# Patient Record
Sex: Female | Born: 1955 | Race: White | Hispanic: No | Marital: Married | Smoking: Never smoker
Health system: Southern US, Community
[De-identification: ages and names within clinical notes are randomized; demographics above are authoritative.]

## PROBLEM LIST (undated history)

## (undated) DIAGNOSIS — N809 Endometriosis, unspecified: Secondary | ICD-10-CM

## (undated) DIAGNOSIS — E785 Hyperlipidemia, unspecified: Secondary | ICD-10-CM

## (undated) DIAGNOSIS — K219 Gastro-esophageal reflux disease without esophagitis: Secondary | ICD-10-CM

## (undated) DIAGNOSIS — R7303 Prediabetes: Secondary | ICD-10-CM

## (undated) DIAGNOSIS — R109 Unspecified abdominal pain: Secondary | ICD-10-CM

## (undated) DIAGNOSIS — F988 Other specified behavioral and emotional disorders with onset usually occurring in childhood and adolescence: Secondary | ICD-10-CM

## (undated) HISTORY — DX: Prediabetes: R73.03

## (undated) HISTORY — PX: ABDOMINAL HYSTERECTOMY: SHX81

## (undated) HISTORY — DX: Other specified behavioral and emotional disorders with onset usually occurring in childhood and adolescence: F98.8

## (undated) HISTORY — DX: Hyperlipidemia, unspecified: E78.5

## (undated) HISTORY — PX: TONSILLECTOMY: SUR1361

## (undated) HISTORY — DX: Gastro-esophageal reflux disease without esophagitis: K21.9

---

## 1999-05-03 ENCOUNTER — Other Ambulatory Visit: Admission: RE | Admit: 1999-05-03 | Discharge: 1999-05-03 | Payer: Self-pay | Admitting: *Deleted

## 2000-06-10 ENCOUNTER — Other Ambulatory Visit: Admission: RE | Admit: 2000-06-10 | Discharge: 2000-06-10 | Payer: Self-pay | Admitting: *Deleted

## 2000-08-25 ENCOUNTER — Encounter (INDEPENDENT_AMBULATORY_CARE_PROVIDER_SITE_OTHER): Payer: Self-pay

## 2000-08-25 ENCOUNTER — Inpatient Hospital Stay (HOSPITAL_COMMUNITY): Admission: RE | Admit: 2000-08-25 | Discharge: 2000-08-27 | Payer: Self-pay | Admitting: *Deleted

## 2001-11-30 ENCOUNTER — Other Ambulatory Visit: Admission: RE | Admit: 2001-11-30 | Discharge: 2001-11-30 | Payer: Self-pay | Admitting: *Deleted

## 2002-02-16 ENCOUNTER — Encounter: Payer: Self-pay | Admitting: *Deleted

## 2002-02-16 ENCOUNTER — Encounter: Admission: RE | Admit: 2002-02-16 | Discharge: 2002-02-16 | Payer: Self-pay | Admitting: *Deleted

## 2002-12-10 ENCOUNTER — Other Ambulatory Visit: Admission: RE | Admit: 2002-12-10 | Discharge: 2002-12-10 | Payer: Self-pay | Admitting: Obstetrics and Gynecology

## 2003-12-12 ENCOUNTER — Other Ambulatory Visit: Admission: RE | Admit: 2003-12-12 | Discharge: 2003-12-12 | Payer: Self-pay | Admitting: *Deleted

## 2004-07-17 ENCOUNTER — Encounter: Admission: RE | Admit: 2004-07-17 | Discharge: 2004-07-17 | Payer: Self-pay | Admitting: Orthopedic Surgery

## 2004-07-23 ENCOUNTER — Encounter: Admission: RE | Admit: 2004-07-23 | Discharge: 2004-07-23 | Payer: Self-pay | Admitting: Orthopedic Surgery

## 2004-08-15 ENCOUNTER — Ambulatory Visit (HOSPITAL_COMMUNITY): Admission: RE | Admit: 2004-08-15 | Discharge: 2004-08-15 | Payer: Self-pay | Admitting: Orthopedic Surgery

## 2005-02-06 ENCOUNTER — Other Ambulatory Visit: Admission: RE | Admit: 2005-02-06 | Discharge: 2005-02-06 | Payer: Self-pay | Admitting: Obstetrics and Gynecology

## 2006-06-30 ENCOUNTER — Encounter: Payer: Self-pay | Admitting: Family Medicine

## 2006-10-27 ENCOUNTER — Ambulatory Visit: Payer: Self-pay | Admitting: Family Medicine

## 2006-12-16 ENCOUNTER — Ambulatory Visit: Payer: Self-pay | Admitting: Family Medicine

## 2008-02-29 ENCOUNTER — Ambulatory Visit: Payer: Self-pay | Admitting: Family Medicine

## 2008-02-29 DIAGNOSIS — R5381 Other malaise: Secondary | ICD-10-CM

## 2008-02-29 DIAGNOSIS — M79609 Pain in unspecified limb: Secondary | ICD-10-CM

## 2008-02-29 DIAGNOSIS — R5383 Other fatigue: Secondary | ICD-10-CM | POA: Insufficient documentation

## 2008-02-29 DIAGNOSIS — IMO0001 Reserved for inherently not codable concepts without codable children: Secondary | ICD-10-CM

## 2008-03-02 LAB — CONVERTED CEMR LAB
AST: 27 units/L (ref 0–37)
Albumin: 4.2 g/dL (ref 3.5–5.2)
Alkaline Phosphatase: 41 units/L (ref 39–117)
BUN: 16 mg/dL (ref 6–23)
Basophils Relative: 0.6 % (ref 0.0–1.0)
Bilirubin, Direct: 0.1 mg/dL (ref 0.0–0.3)
Chloride: 101 meq/L (ref 96–112)
Eosinophils Relative: 1.5 % (ref 0.0–5.0)
Glucose, Bld: 91 mg/dL (ref 70–99)
Hemoglobin: 13 g/dL (ref 12.0–15.0)
Lymphocytes Relative: 38.4 % (ref 12.0–46.0)
Monocytes Relative: 7.4 % (ref 3.0–12.0)
Neutro Abs: 3.3 10*3/uL (ref 1.4–7.7)
Neutrophils Relative %: 52.1 % (ref 43.0–77.0)
Potassium: 4.6 meq/L (ref 3.5–5.1)
RBC: 4.45 M/uL (ref 3.87–5.11)
Sodium: 141 meq/L (ref 135–145)
TSH: 1.31 microintl units/mL (ref 0.35–5.50)
Total Protein: 7.2 g/dL (ref 6.0–8.3)
WBC: 6.4 10*3/uL (ref 4.5–10.5)

## 2008-03-08 ENCOUNTER — Encounter: Payer: Self-pay | Admitting: Family Medicine

## 2008-03-08 DIAGNOSIS — N809 Endometriosis, unspecified: Secondary | ICD-10-CM | POA: Insufficient documentation

## 2008-11-03 ENCOUNTER — Telehealth (INDEPENDENT_AMBULATORY_CARE_PROVIDER_SITE_OTHER): Payer: Self-pay | Admitting: *Deleted

## 2009-03-15 ENCOUNTER — Ambulatory Visit: Payer: Self-pay | Admitting: Family Medicine

## 2009-03-15 DIAGNOSIS — S139XXA Sprain of joints and ligaments of unspecified parts of neck, initial encounter: Secondary | ICD-10-CM

## 2009-05-31 LAB — CONVERTED CEMR LAB
Pap Smear: NORMAL
Pap Smear: NORMAL
Pap Smear: NORMAL

## 2009-07-28 ENCOUNTER — Telehealth: Payer: Self-pay | Admitting: Family Medicine

## 2009-08-31 ENCOUNTER — Telehealth: Payer: Self-pay | Admitting: Family Medicine

## 2009-09-04 ENCOUNTER — Ambulatory Visit: Payer: Self-pay | Admitting: Family Medicine

## 2009-09-04 DIAGNOSIS — M542 Cervicalgia: Secondary | ICD-10-CM

## 2009-09-04 DIAGNOSIS — M62838 Other muscle spasm: Secondary | ICD-10-CM

## 2009-10-03 ENCOUNTER — Encounter: Payer: Self-pay | Admitting: Family Medicine

## 2009-10-24 ENCOUNTER — Encounter: Payer: Self-pay | Admitting: Family Medicine

## 2009-11-17 ENCOUNTER — Telehealth: Payer: Self-pay | Admitting: Family Medicine

## 2009-11-29 ENCOUNTER — Telehealth: Payer: Self-pay | Admitting: Family Medicine

## 2010-02-15 ENCOUNTER — Telehealth: Payer: Self-pay | Admitting: Family Medicine

## 2010-03-26 ENCOUNTER — Telehealth (INDEPENDENT_AMBULATORY_CARE_PROVIDER_SITE_OTHER): Payer: Self-pay | Admitting: *Deleted

## 2010-03-27 ENCOUNTER — Ambulatory Visit: Payer: Self-pay | Admitting: Family Medicine

## 2010-04-03 LAB — CONVERTED CEMR LAB
ALT: 12 units/L (ref 0–35)
AST: 18 units/L (ref 0–37)
Alkaline Phosphatase: 42 units/L (ref 39–117)
BUN: 13 mg/dL (ref 6–23)
Basophils Relative: 0.7 % (ref 0.0–3.0)
Chloride: 110 meq/L (ref 96–112)
Cholesterol: 192 mg/dL (ref 0–200)
Eosinophils Absolute: 0.1 10*3/uL (ref 0.0–0.7)
Eosinophils Relative: 2 % (ref 0.0–5.0)
GFR calc non Af Amer: 74.73 mL/min (ref >60)
Glucose, Bld: 92 mg/dL (ref 70–99)
Hemoglobin: 11.7 g/dL — ABNORMAL LOW (ref 12.0–15.0)
LDL Cholesterol: 108 mg/dL — ABNORMAL HIGH (ref 0–99)
Lymphocytes Relative: 37.7 % (ref 12.0–46.0)
MCHC: 33.4 g/dL (ref 30.0–36.0)
Monocytes Relative: 9 % (ref 3.0–12.0)
Neutro Abs: 2.9 10*3/uL (ref 1.4–7.7)
Neutrophils Relative %: 50.6 % (ref 43.0–77.0)
Potassium: 4.3 meq/L (ref 3.5–5.1)
RBC: 4.3 M/uL (ref 3.87–5.11)
Sodium: 144 meq/L (ref 135–145)
Total Bilirubin: 0.4 mg/dL (ref 0.3–1.2)
VLDL: 20.6 mg/dL (ref 0.0–40.0)
WBC: 5.7 10*3/uL (ref 4.5–10.5)

## 2010-04-20 ENCOUNTER — Ambulatory Visit: Payer: Self-pay | Admitting: Family Medicine

## 2010-06-15 ENCOUNTER — Emergency Department (HOSPITAL_COMMUNITY): Admission: EM | Admit: 2010-06-15 | Discharge: 2010-06-15 | Payer: Self-pay | Admitting: Family Medicine

## 2010-08-16 ENCOUNTER — Encounter: Admission: RE | Admit: 2010-08-16 | Discharge: 2010-08-16 | Payer: Self-pay | Admitting: Obstetrics and Gynecology

## 2010-10-30 NOTE — Progress Notes (Signed)
----   Converted from flag ---- ---- 03/26/2010 9:38 AM, Hannah Beat MD wrote: Prephysical Labs, several days before, fasting BMP, HFP, FLP, CBC with diff, TSH: v77.91, v77.1, ,780.79  ---- 03/26/2010 7:55 AM, Liane Comber CMA (AAMA) wrote: Ermalene Searing pt is scheduled for cpx labs tomorrow, what labs to draw and dx codes? Thanks Tasha ------------------------------

## 2010-10-30 NOTE — Progress Notes (Signed)
Summary: diflunec sod  Phone Note Refill Request Message from:  Scriptline on November 29, 2009 11:30 AM  Refills Requested: Medication #1:  DICLOFENAC SODIUM 75 MG TBEC 1 tab by mouth two times a day   Supply Requested: [facility] month [facility] [address]   Method Requested: Electronic Initial call taken by: Benny Lennert CMA (AAMA),  November 29, 2009 11:30 AM    Prescriptions: DICLOFENAC SODIUM 75 MG TBEC (DICLOFENAC SODIUM) 1 tab by mouth two times a day  #30 x 0   Entered and Authorized by:   Kerby Nora MD   Signed by:   Kerby Nora MD on 11/29/2009   Method used:   [facility] to        [facility]  [id number]       [address]       [place], [place]  [address]       Ph: [phone] or [phone]       Fax: [phone]   RxID:   [id number]

## 2010-10-30 NOTE — Assessment & Plan Note (Signed)
Summary: CPX/DLO   Vital Signs:  Patient profile:   55 year old female Height:      65.5 inches Weight:      134.4 pounds BMI:     22.10 Temp:     98.0 degrees F oral Pulse rate:   80 / minute Pulse rhythm:   regular BP sitting:   110 / 70  (left arm) Cuff size:   regular  Vitals Entered By: Benny Lennert CMA Duncan Dull) (April 20, 2010 9:58 AM)  History of Present Illness: Chief complain Chronic med management  13 lb weight loss in past year. Eating healthy and exercising intermittantly. once a week.  Does regular stretching exercsies.   HAS intermittant right trapezius strain..uses skelaxin and diclofenac about 1 time a week. HAs seen Dr. Patsy Lager.   Reviewed labs and prevention in detail.  Goes to GYN for CPX.   Problems Prior to Update: 1)  Screening For Diabetes Mellitus  (ICD-V77.1) 2)  Screening For Lipoid Disorders  (ICD-V77.91) 3)  Neck Pain  (ICD-723.1) 4)  Muscle Spasm, Trapezius  (ICD-728.85) 5)  Cervical Muscle Strain  (ICD-847.0) 6)  Endometriosis  (ICD-617.9) 7)  Leg Pain  (ICD-729.5) 8)  Fatigue, Chronic  (ICD-780.79) 9)  Myalgia  (ICD-729.1)  Current Medications (verified): 1)  Valtrex 1 Gm  Tabs (Valacyclovir Hcl) .... Take 1 Tablet By Mouth Once A Day 2)  Vivelle 0.05 Mg/24hr Pttw (Estradiol) .... ? Strength.  Change Patch Twice Weekly. 3)  Diclofenac Sodium 75 Mg Tbec (Diclofenac Sodium) .Marland Kitchen.. 1 Tab By Mouth Two Times A Day 4)  Skelaxin 800 Mg Tabs (Metaxalone) .Marland Kitchen.. 1 By Mouth Three Times A Day As Needed Muscle Spasm  Allergies (verified): No Known Drug Allergies  Past History:  Past medical, surgical, family and social histories (including risk factors) reviewed, and no changes noted (except as noted below).  Past Medical History: Reviewed history from 03/08/2008 and no changes required. Current Problems:  DEGENERATIVE DISC DISEASE (ICD-722.6) ENDOMETRIOSIS (ICD-617.9) LEG PAIN (ICD-729.5) FATIGUE, CHRONIC (ICD-780.79) MYALGIA (ICD-729.1)    Past Surgical History: Reviewed history from 03/08/2008 and no changes required. Hysterectomy, partial, has L ovary 2000 Tonsillectomy MRI, left hip, partial left hamstring tear and tendonopathy 2005 2 views cervical spine  Family History: Reviewed history from 03/08/2008 and no changes required. no RA, SLE, IBD Father: Died 31, bladder CA Mother: Alive 53, breast CA at age 43 Siblings: 2 brothers with high cholesterol, 2 sisters with DM No MI < 30  Social History: Reviewed history from 03/08/2008 and no changes required. 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  Review of Systems General:  Denies chills and fatigue. CV:  Denies chest pain or discomfort. Resp:  Denies shortness of breath. GI:  Denies abdominal pain. GU:  Denies dysuria. Derm:  Denies lesion(s).  Physical Exam  General:  Well-developed,well-nourished,in no acute distress; alert,appropriate and cooperative throughout examination Eyes:  No corneal or conjunctival inflammation noted. EOMI. Perrla. Funduscopic exam benign, without hemorrhages, exudates or papilledema. Vision grossly normal. Ears:  External ear exam shows no significant lesions or deformities.  Otoscopic examination reveals clear canals, tympanic membranes are intact bilaterally without bulging, retraction, inflammation or discharge. Hearing is grossly normal bilaterally. Nose:  External nasal examination shows no deformity or inflammation. Nasal mucosa are pink and moist without lesions  or exudates. Mouth:  Oral mucosa and oropharynx without lesions or exudates.  Teeth in good repair. Neck:  no carotid bruit or thyromegaly no cervical or supraclavicular lymphadenopathy  Breasts:  per  GYN Lungs:  Normal respiratory effort, chest expands symmetrically. Lungs are clear to auscultation, no crackles or wheezes. Heart:  Normal rate and regular rhythm. S1 and S2 normal without gallop, murmur, click, rub or other extra sounds. Abdomen:  Bowel sounds positive,abdomen soft and non-tender without masses, organomegaly or hernias noted. Genitalia:  per GYN Msk:  No deformity or scoliosis noted of thoracic or lumbar spine.   Pulses:  R and L posterior tibial pulses are full and equal bilaterally  Extremities:  no edema  Neurologic:  No cranial nerve deficits noted. Station and gait are normal.  Sensory, motor and coordinative functions appear intact. Skin:  Intact without suspicious lesions or rashes Psych:  Cognition and judgment appear intact. Alert and cooperative with normal attention span and concentration. No apparent delusions, illusions, hallucinations   Impression & Recommendations:  Problem # 1:  MUSCLE SPASM, TRAPEZIUS (ICD-728.85) Skelaxin and NSAID as needed.  Problem # 2:  FATIGUE, CHRONIC (ICD-780.79) Encouraged regular exercsie.Ashley Lin states this has improved some. Still significant stress in life.   Complete Medication List: 1)  Valtrex 1 Gm Tabs (Valacyclovir hcl) .... Take 1 tablet by mouth once a day 2)  Vivelle 0.05 Mg/24hr Pttw (Estradiol) .... ? strength.  change patch twice weekly. 3)  Diclofenac Sodium 75 Mg Tbec (Diclofenac sodium) .Marland Kitchen.. 1 tab by mouth two times a day 4)  Skelaxin 800 Mg Tabs (Metaxalone) .Marland Kitchen.. 1 by mouth three times a day as needed muscle spasm  Other Orders: Tdap => 34yrs IM ([address]) Admin 1st Vaccine (60454)  Patient Instructions: 1)  Please schedule a follow-up appointment in 1 year.  Prescriptions: DICLOFENAC SODIUM 75 MG TBEC (DICLOFENAC SODIUM) 1 tab by mouth two times a day  #30 x 0   Entered and Authorized by:   Kerby Nora MD   Signed by:   Kerby Nora MD on 04/20/2010   Method used:   Print then Give to Patient    RxID:   [id number] SKELAXIN 800 MG TABS (METAXALONE) 1 by mouth three times a day as needed muscle spasm  #30 x 0   Entered and Authorized by:   Kerby Nora MD   Signed by:   Kerby Nora MD on 04/20/2010   Method used:   Print then Give to Patient   RxID:   [id number]   Current Allergies (reviewed today): No known allergies   Last PAP:  Normal (06/30/2006 9:15:11 AM) PAP Result Date:  05/31/2009 PAP Result:  normal PAP Next Due:  1 yr Last Mammogram:  Normal (09/30/2005 9:15:11 AM) Mammogram Result Date:  05/31/2009 Mammogram Result:  normal Mammogram Next Due:  1 yr     Tetanus/Td Vaccine    Vaccine Type: Tdap    Site: left deltoid    Mfr: GlaxoSmithKline    Dose: 0.5 ml    Route: IM    Given by: Benny Lennert CMA (AAMA)    Exp. Date: 12/23/2011    Lot #: [id number]    VIS given: 08/18/07 version given April 20, 2010.     Immunizations Administered:  Tetanus Vaccine:    Vaccine Type: Tdap    Site: left deltoid    Mfr: GlaxoSmithKline    Dose: 0.5 ml    Route: IM    Given by: Benny Lennert CMA (AAMA)  Exp. Date: 12/23/2011    Lot #: [id number]    VIS given: 08/18/07 version given April 20, 2010.

## 2010-10-30 NOTE — Letter (Signed)
Summary: Patient Does Not Want to Schedule/[facility]  Patient Does Not Want to Schedule/[facility]   Imported By: Lanelle Bal 10/16/2009 08:43:46  _____________________________________________________________________  External Attachment:    Type:   Image     Comment:   External Document

## 2010-10-30 NOTE — Progress Notes (Signed)
Summary: cyclobenzeprine  Phone Note Refill Request Message from:  Scriptline on Feb 15, 2010 11:32 AM  Refills Requested: Medication #1:  CYCLOBENZAPRINE HCL 5 MG TABS 1-2 tab by mouth at bedtime as needed muscle spasm   Supply Requested: 1 month [facility] [address]   Method Requested: Electronic Initial call taken by: Benny Lennert CMA Duncan Dull),  Feb 15, 2010 11:32 AM  Follow-up for Phone Call        No refills until reeeval as stated at last refill. Also due for CPX if not done elsewhere.  Follow-up by: Kerby Nora MD,  Feb 16, 2010 1:28 PM  Additional Follow-up for Phone Call Additional follow up Details #1::        Advised pharmacist and pt.  Pt has a physical scheduled for july and she says she will wait till then. Additional Follow-up by: Lowella Petties CMA,  Feb 16, 2010 2:15 PM

## 2010-10-30 NOTE — Procedures (Signed)
Summary: Colonoscopy by Dr.William Outlaw,[facility]  Colonoscopy by Dr[facility]William Outlaw,[facility]   Imported By: Beau Fanny 10/31/2009 16:28:02  _____________________________________________________________________  External Attachment:    Type:   Image     Comment:   External Document  Appended Document: Orders Update     Clinical Lists Changes  Observations: Added new observation of HEMOCULTDUE: Not Indicated (11/01/2009 8:38) Added new observation of FLEXSIGDUE: Not Indicated (11/01/2009 8:38) Added new observation of LST COLON DT: 11/01/2009 (11/01/2009 8:38) Added new observation of COLONNXTDUE: 11/01/2014 (11/01/2009 8:38) Added new observation of COLONOSCOPY: hemmorhoids (11/01/2009 8:38) Added new observation of FLUVAXDUE: 08/01/2007 (11/01/2009 8:38) Added new observation of TDBOOSTDUE: 09/30/2008 (11/01/2009 8:38) Added new observation of PAP DUE: 07/01/2007 (11/01/2009 8:38) Added new observation of MAMMO DUE: 09/30/2006 (11/01/2009 8:38) Added new observation of CREATNXTDUE: 02/28/2009 (11/01/2009 8:38) Added new observation of POTASSIUMDUE: 02/28/2009 (11/01/2009 8:38)      Flex Sig Next Due:  Not Indicated Colonoscopy Result Date:  11/01/2009 Colonoscopy Result:  hemmorhoids Colonoscopy Next Due:  5 yr Hemoccult Next Due:  Not Indicated

## 2010-10-30 NOTE — Progress Notes (Signed)
Summary: cyclobenzaprine  Phone Note Refill Request Message from:  Scriptline on November 17, 2009 10:19 AM  Refills Requested: Medication #1:  CYCLOBENZAPRINE HCL 5 MG TABS 1-2 tab by mouth at bedtime as needed muscle spasm   Supply Requested: 1 month [facility] [address]   Method Requested: Electronic Initial call taken by: Benny Lennert CMA Duncan Dull),  November 17, 2009 10:20 AM  Follow-up for Phone Call        If needs further refills..needs to be reevaluated.  Follow-up by: Kerby Nora MD,  November 17, 2009 11:06 AM  Additional Follow-up for Phone Call Additional follow up Details #1::        rx called in and patient advisd.Consuello Masse CMA  Additional Follow-up by: Benny Lennert CMA Duncan Dull),  November 17, 2009 11:37 AM    Prescriptions: CYCLOBENZAPRINE HCL 5 MG TABS (CYCLOBENZAPRINE HCL) 1-2 tab by mouth at bedtime as needed muscle spasm  #20 x 0   Entered and Authorized by:   Kerby Nora MD   Signed by:   Kerby Nora MD on 11/17/2009   Method used:   [facility]ph[facility] to ...       [facility]  [address] [id number]       [address]       [place], [place]  [address]       Ph: [phone] or [phone]       Fax: [phone]   RxID:   [id number]

## 2011-02-15 NOTE — Assessment & Plan Note (Signed)
[facility]                           [place] OFFICE NOTE   Ashley Lin, Ashley Lin                  MRN:          [id number]  DATE:10/27/2006                            DOB:          [birth year]    NEW PATIENT HISTORY AND PHYSICAL:   CHIEF COMPLAINT:  A 55 year old white female here to establish a new  doctor.   HISTORY OF PRESENT ILLNESS:  The patient comes to the clinic today with  the following concerns.  1. Rash, chronic.  She states that she first experienced what was      diagnosed as shingles 4 years ago on her right upper buttock.  She      was treated with prednisone and Valtrex, and the rash resolved.      Over the past few years, she continues to have recurrences, about 6      times since then.  She notices the breakouts with stress and      fatigue.  They are always over the same area of her right buttock.      She states that she recently is getting over an area that is      healing now.  Her previous doctor had felt that this is most likely      not shingles and a type of herpes.  The rash is usually clear white      blisters on a red background, tender, and is associated with      fatigue.  She is on Valtrex prevention daily and increases to      higher doses when she has an outbreak.  2. Urinary urge, chronic.  She has been noticing a constant urge to      urinate for about 6 months.  She never feels completely empty.  She      was at her gynecologist, and he examined her and stated that she      did not have a cystocele, but felt that her bladder could be      getting kinked.  He gave her Vesicare samples, which did not help      after being on them for 2 weeks.  She states that the urge to      urinate is worse when she is sitting.  She frequently urinates tiny      amounts with occasional full bladder voids.  She occasionally loses      urine with sneezing about once every few months.  She denies      dysuria or blood in  her urine.  3. Bilateral buttock pain, chronic.  For many years, she has had this      concern with a constant ache in both her buttocks.  There is no      tenderness to palpation to touch, and no change in the pain with      going up steps.  She does have some pain, though, when she lays in      1 position at night.  She denies weakness, numbness.  She has had      somewhat of a work-up of  this previously with x-rays and an MRI of      her left hip that only showed possible partial left hamstring, and      all this was done in 2005.  She had also had a bone scan that      reflected the same.  She has seen 2 orthopedic doctors who felt      that there was no arthritis or specific injury other than hamstring      strain at that point in time.   ALLERGIES:  None.   MEDICATIONS:  1. Valtrex daily.  2. Multivitamin daily.  3. Vivelle one patch two times per week.   PAST MEDICAL HISTORY:  1. Endometriosis.  2. Degenerative disk disease.   HOSPITALIZATIONS/SURGERIES/PROCEDURES:  1. 2000 - hysterectomy, partial, having only left ovary for      endometriosis.  2. Tonsillectomy.  3. 2005 - MRI, left hip.  Partial left hamstring.  4. Two view cervical spine showing mild degenerative disk disease.  5. Mammogram in January 2007 negative.  6. Pap smear in October 2007 negative.   SOCIAL HISTORY:  She does not smoke.  She drinks 1 or 2 alcoholic  beverages every few months.  She does not use drugs.  She works as a  Naval architect in a Engineer, mining.  She is divorced and now  single, but has a live-in partner for many years.  She denies any  physical or verbal abuse.  She has 2 children.  She walks nightly for  about half an hour to 45 minutes at a time.  For her diet, she states it  is fairly healthy with fruits, vegetables, water and only occasional  fast food, but she does skip breakfast frequently.   FAMILY HISTORY:  Father deceased at age 29 with bladder cancer.  Mother   alive at age 31 with breast cancer which she developed at age 9.  No  family history of myocardial infarction less than 65.  She has two  brothers, one with elevated cholesterol.  She was two sisters, one of  whom has diabetes.  There is no other type of cancer that runs in the  family.   PHYSICAL EXAMINATION:  VITAL SIGNS:  Height 64.5 inches.  Weight 141.6.  Blood pressure 120/70, pulse 76, temperature 97.7.  GENERAL:  Healthy-appearing female in no apparent distress.  HEENT:  Pupils equal, round and reactive to light.  Extraocular muscles  intact.  Oropharynx clear.  Tympanic membranes clear.  Nares clear.  NECK:  No thyromegaly.  No lymphadenopathy, supraclavicular or cervical.  CARDIOVASCULAR:  Regular rate and rhythm.  No murmurs, rubs, or gallops.  PULMONARY:  Clear to auscultation bilaterally.  No wheezes, rales or  rhonchi.  ABDOMEN:  Soft, nontender.  Normoactive bowel sounds.  No  hepatosplenomegaly.  MUSCULOSKELETAL:  Strength 5/5 in the upper and lower extremities.  Low  back nontender to palpation.  Negative straight leg test.  Negative  Faber's.  NEUROLOGIC:  Cranial nerves II-XII grossly intact.  Reflexes 2+  patellar.  Sensation intact in upper and lower extremities.  SKIN:  Healing rash on the right upper buttock.  No blisters at this  point in time.   ASSESSMENT AND PLAN:  1. 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  To      verify this, we can do a viral culture by unroofing  a vesicle if      the returns.  She may continue with her current treatment at this      point in time, because I do feel it is appropriate.  We did discuss      the consideration of just treating outbreaks as opposed to being on      preventative medicine.  2. Urinary urgency.  She may have a structural issue that is causing     her urinary urge, given the fact that is gets worse when she is      sitting.   Her other symptoms do sound like bladder spasm.  I will      try her on Detrol LA to determine if this medication helps more      than the Vesicare.  I will also refer her to a urologist for      assessment for structural issues causing her symptoms.  3. Bilateral buttock pain.  She has a normal neurologic exam and no      evidence of radiculopathy from sciatica.  She also has no evidence      of sacroiliac strain.  It is unclear at this point what is causing      her tenderness, if it is not just musculoskeletal tightness.  She      was given exercises to do to stretch her low back and hips.  She      may use diclofenac 75 mg p.o. b.i.d. p.r.n. pain.  In the meantime,      I will obtain previous records from her doctor to determine what      has been done in the past to look into this.  4. Prevention.  At this point in time, she is up to date with      prevention, except for a cholesterol panel, which we will obtain.      I will obtain records from her previous doctor.     Kerby Nora, MD  Electronically Signed    AB/MedQ  DD: 10/28/2006  DT: 10/28/2006  Job #: [id number]

## 2011-02-15 NOTE — Op Note (Signed)
[facility]  Patient:    Ashley Lin, Ashley Lin           MRN: [id number] Proc. Date: 08/25/00 Adm. Date:  [id number] Attending:  Donne Hazel                           Operative Report  PREOPERATIVE DIAGNOSES: 1. Pelvic pain. 2. History of endometriosis. 3. Abnormal uterine bleeding.  POSTOPERATIVE DIAGNOSES: 1. Pelvic pain. 2. History of endometriosis. 3. Abnormal uterine bleeding. 4. Endometriosis noted.  PROCEDURES: 1. Laparoscopic-assisted vaginal hysterectomy. 2. Right salpingo-oophorectomy.  SURGEON:  Willey Blade, M.D.  ASSISTANT:  Duke Salvia. Marcelle Overlie, M.D.  ESTIMATED BLOOD LOSS:  200 cc.  ANESTHESIA:  General endotracheal.  FINDING AT TIME OF LAPAROSCOPY:  Three small areas of endometriosis were noted in the right pelvis in the paraovarian region.  This was noted in the mesovarian and near the right tube.  The left ovary was normal.  The right ovary also normal except for the small endometriosis.  The uterus was upper limits of normal.  The upper abdomen was not visualized.  The bowel appeared to be normal.  DESCRIPTION OF PROCEDURE:  The patient was taken to the operating room where a general endotracheal anesthetic was administered.  The patient was placed on the operating table in dorsal lithotomy position.  The abdomen, perineum, and vagina was prepped and draped in the usual sterile fashion with Betadine scrub and paint.  The patient did receive a preoperative antibiotic.  A Foley catheter was inserted.  A Hulka tenaculum was placed inside the intrauterine cavity for uterine manipulation.  Next, a small transverse infraumbilical skin incision was made through which a Veress needle was inserted atraumatically into the abdominal cavity.  A pneumoperitoneum was then created with two liters of carbon dioxide gas.  The Veress needle was removed, and a disposable laparoscopic trocar was inserted atraumatically into the  abdominal cavity.  A laparoscope was then inserted with the above-noted findings.  Two accessory incisions were then made two fingerbreadths above the pubic symphysis and in the right and left lower quadrants.  Through these, two 5 mm trocars were inserted in the usual fashion and under direct, continuous laparoscopic guidance.  Next, the pelvis was visualized with the above-noted findings.  The most notable finding were three small areas of endometriosis in the right pelvis near the ovary.  There was no further endometriosis identified.  Next, attention was turned to right oophorectomy.  A 5 mm scope was placed, and the infundibulopelvic ligament was clamped well away from the right ureter and close to the right ovary.  This was done with an endoscopic staple.  Another staple line was placed in the uterine ovarian area and extending to the round ligament.  Next, a single suture line of endoscopic staples was placed on the uterine ovarian ligament on the left. Attention was then turned to the vaginal portion of this procedure.  The Hulka tenaculum was removed, and a weighted speculum was placed in the posterior fornix of the vagina.  The cervix was grasped with a Jacobs tenaculum and the posterior cul-de-sac sharply and atraumatically entered. The uterosacral ligaments were then clamped bilaterally and sharply created. These were then suture ligated with a transfixing suture of 0 Vicryl.  A circumferential incision was made around the anterior portion of the cervix, and a bladder flap sharply and bluntly created over the lower uterine segment. A curved Deaver placed behind the bladder  to ensure its protection during the procedure.  Successive bites were then carried up the body of the uterus until the suture lines of endoscopic staples were met.  These were all sharply created with curved Heaney clamps and suture ligated with 0 Vicryl suture. All vascular pedicles were created close to  the uterine body.  The suture lines were eventually met and the uterus dissected free easily.  The operative areas were visualized and hemostasis noted.  Attention was then turned to closure of the vaginal cuff.  First, the posterior cul-de-sac was obliterated by transfixing and tying the uterosacral ligaments together.  This was done with two figure-of-eight sutures of 0 Vicryl.  Again, good hemostasis was noted.  The vaginal cuff was then closed in an anterior to posterior direction with multiple interrupted figure-of-eight sutures of 0 Vicryl.  Good hemostasis was noted.  The laparoscope was then used to visualize the operative areas, and good hemostasis was noted throughout.  Of note, the three small areas of endometriosis were cauterized thoroughly with the bipolar cautery prior to dissection.  Good hemostasis was then noted.  All abdominal instruments were then removed.  The small infraumbilical skin incision was closed with two sutures of 0 Vicryl in the muscle fascia.  The skin incisions were then closed with multiple interrupted sutures of 3-0 Vicryl rapide.  The patient was then awakened, extubated, and taken to the recovery room in good condition.  There were no postoperative complications.  Final sponge, needle, and instrument counts were correct x 3. DD:  08/25/00 TD:  08/25/00 Job: [id number]

## 2011-02-15 NOTE — Discharge Summary (Signed)
[facility]  Patient:    Ashley Lin, Ashley Lin           MRN: [id number] Adm. Date:  [id number] Disc. Date: [id number] Attending:  Donne Hazel                           Discharge Summary  HISTORY OF PRESENT ILLNESS:  Ms. Ashley Lin is a 55 year old female, gravida 3, para 3, status post bilateral tubal ligation, the patient was admitted for definitive treatment for chronic pelvic pain. She has a history of endometriosis and a history of chronic, recurrent acute and chronic pelvic pain. The patient has undergone a series of conservative medical management and at this time is admitted for definitive therapy in the form of laparoscopically assisted vaginal hysterectomy and right salpingo-oophorectomy. She has mostly had pain on her right side.  ADMITTING DIAGNOSES: 1. Pelvic pain--acute and chronic. 2. History of endometriosis. 3. History of abnormal uterine bleeding.  HOSPITAL COURSE:  The patient was admitted on the same day of surgery, November 26, where she underwent a laparoscopically assisted vaginal hysterectomy and right salpingo-oophorectomy. The surgery went well and blood loss was approximately 100 cc. A small amount of endometriosis was encountered at the time of surgery. However, the surgery went well.  The patients postoperative course also went very well. She was slowly advanced to a regular diet which she was tolerating well prior to discharge. Her hemoglobin dropped from a preoperative value of 10.7 to a stable value of 8.0 postoperatively. She was asymptomatic with this degree of anemia. She was ambulatory and quickly resumed normal bowel and bladder function after her surgery. She had no complications in the postoperative course and is routinely discharged on postoperative day #2 in stable condition.  DISCHARGE DIAGNOSES: 1. Status post laparoscopically assisted vaginal hysterectomy and right    salpingo-oophorectomy for  pelvic pain and abnormal uterine bleeding. 2. Anemia--stable and asymptomatic.  PLAN: 1. Home. 2. Routine postoperative instructions given. 3. Tylox #40. 4. Nu-Iron 150 to start in 1 week after bowel activity returns to normal. 5. Nothing per vagina x 6 weeks. No heavy lifting. 6. Follow-up in the office in 1 week to recheck hemoglobin and for further    postoperative evaluation.       DD:  09/08/00 TD:  09/08/00 Job: [id number]

## 2011-12-24 ENCOUNTER — Emergency Department (INDEPENDENT_AMBULATORY_CARE_PROVIDER_SITE_OTHER)
Admission: EM | Admit: 2011-12-24 | Discharge: 2011-12-24 | Disposition: A | Discharge status: Home / Self Care | Payer: 59 | Source: Home / Self Care | Attending: Family Medicine | Admitting: Family Medicine

## 2011-12-24 ENCOUNTER — Telehealth: Payer: Self-pay | Admitting: Family Medicine

## 2011-12-24 ENCOUNTER — Encounter (HOSPITAL_COMMUNITY): Payer: Self-pay | Admitting: *Deleted

## 2011-12-24 DIAGNOSIS — N949 Unspecified condition associated with female genital organs and menstrual cycle: Secondary | ICD-10-CM

## 2011-12-24 DIAGNOSIS — R102 Pelvic and perineal pain: Secondary | ICD-10-CM

## 2011-12-24 LAB — POCT URINALYSIS DIP (DEVICE)
Bilirubin Urine: NEGATIVE
Glucose, UA: NEGATIVE mg/dL
Ketones, ur: NEGATIVE mg/dL
Leukocytes, UA: NEGATIVE
Nitrite: NEGATIVE
pH: 7 (ref 5.0–8.0)

## 2011-12-24 MED ORDER — TAMSULOSIN HCL 0.4 MG PO CAPS: 0.4000 mg | ORAL_CAPSULE | Freq: Every day | ORAL | Status: DC

## 2011-12-24 NOTE — ED Notes (Signed)
Pt  Reports     Pain  r  Side      Since  Last   Week  denya  ny  Discharge  Bleeding    Or  Other  Symptoms  Pt  Reports  She  Has  Had  A  Hysterectomy       In past  -  She  Is  Sitting  Upright on  Exam table  Speaking in  Complete  sentances       Skin is  Warm  /  Dry

## 2011-12-24 NOTE — Discharge Instructions (Signed)
Drink plenty of fluids, see dr Rana Snare for recheck and urology referral if indicated.

## 2011-12-24 NOTE — Telephone Encounter (Signed)
To: [facility] (Daytime Triage) Fax: [phone] From: [facility] Date/ Time: 12/24/2011 9:39 AM Taken By: Patriciaann Clan, RN Caller: Debbie Facility: Not Collected Patient: Ashley Lin DOB: May 17, 1956 Phone: [phone] Reason for Call: Caller was unable to be reached on callback - Left Message Regarding Appointment: No Appt Date: Appt Time: Unknown Provider: Reason: Details: Outcome:

## 2011-12-24 NOTE — ED Provider Notes (Signed)
History     CSN: [id number]  Arrival date & time 12/24/11  1421   First MD Initiated Contact with Patient 12/24/11 1511      Chief Complaint  Patient presents with  . Abdominal Pain    (Consider location/radiation/quality/duration/timing/severity/associated sxs/prior treatment) Patient is a 56 y.o. female presenting with abdominal pain. The history is provided by the patient.  Abdominal Pain The primary symptoms of the illness include abdominal pain. The primary symptoms of the illness do not include nausea, vomiting, diarrhea, dysuria, vaginal discharge or vaginal bleeding. The current episode started more than 2 days ago (1 1/2 wk of sx rlq pain.). The onset of the illness was gradual. The problem has not changed since onset. The patient states that she believes she is currently not pregnant. Additional symptoms associated with the illness include back pain. Symptoms associated with the illness do not include chills, anorexia, urgency or frequency. Associated medical issues comments: s/p hyst and right oophorectomy for endometriosis.Marland Kitchen    History reviewed. No pertinent past medical history.  Past Surgical History  Procedure Date  . Abdominal hysterectomy     History reviewed. No pertinent family history.  History  Substance Use Topics  . Smoking status: Not on file  . Smokeless tobacco: Not on file  . Alcohol Use: Yes     occasonal    OB History    Grav Para Term Preterm Abortions TAB SAB Ect Mult Living                  Review of Systems  Constitutional: Negative for chills.  Gastrointestinal: Positive for abdominal pain. Negative for nausea, vomiting, diarrhea and anorexia.  Genitourinary: Negative for dysuria, urgency, frequency, vaginal bleeding and vaginal discharge.  Musculoskeletal: Positive for back pain.    Allergies  Review of patient's allergies indicates no known allergies.  Home Medications   Current Outpatient Rx  Name Route Sig Dispense Refill    . TAMSULOSIN HCL 0.4 MG PO CAPS Oral Take 1 capsule (0.4 mg total) by mouth daily. 30 capsule 0    BP 129/72  Pulse 80  Temp(Src) 98.5 F (36.9 C) (Oral)  Resp 16  SpO2 98%  Physical Exam  Nursing note and vitals reviewed. Constitutional: She appears well-developed and well-nourished.  HENT:  Head: Normocephalic.  Abdominal: Soft. Normal appearance and bowel sounds are normal. She exhibits no distension and no mass. There is tenderness. There is no rigidity, no rebound, no guarding and no CVA tenderness. Hernia confirmed negative in the right inguinal area.      ED Course  Procedures (including critical care time)  Labs Reviewed  POCT URINALYSIS DIP (DEVICE) - Abnormal; Notable for the following:    Hgb urine dipstick SMALL (*)    All other components within normal limits   No results found.   1. Pelvic pain in female       MDM  U/a tr bld        Linna Hoff, MD 12/24/11 1550

## 2011-12-29 NOTE — Telephone Encounter (Signed)
Please try to contact pt once again to make sure needs met. Please document if reached or not.

## 2011-12-30 NOTE — Telephone Encounter (Signed)
Could not contact patient but, she was seen in UC the same day

## 2012-07-23 ENCOUNTER — Encounter: Payer: Self-pay | Admitting: Family Medicine

## 2012-07-23 ENCOUNTER — Other Ambulatory Visit: Payer: Self-pay | Admitting: Family Medicine

## 2012-07-23 ENCOUNTER — Ambulatory Visit (INDEPENDENT_AMBULATORY_CARE_PROVIDER_SITE_OTHER): Payer: 59 | Admitting: Family Medicine

## 2012-07-23 VITALS — BP 102/68 | HR 68 | Temp 98.0°F | Wt 154.5 lb

## 2012-07-23 DIAGNOSIS — R319 Hematuria, unspecified: Secondary | ICD-10-CM

## 2012-07-23 DIAGNOSIS — R1013 Epigastric pain: Secondary | ICD-10-CM | POA: Insufficient documentation

## 2012-07-23 DIAGNOSIS — R3129 Other microscopic hematuria: Secondary | ICD-10-CM | POA: Insufficient documentation

## 2012-07-23 DIAGNOSIS — R1031 Right lower quadrant pain: Secondary | ICD-10-CM | POA: Insufficient documentation

## 2012-07-23 LAB — POCT UA - MICROSCOPIC ONLY
RBC, urine, microscopic: 0
WBC, Ur, HPF, POC: 0

## 2012-07-23 LAB — POCT URINALYSIS DIPSTICK
Bilirubin, UA: NEGATIVE
Glucose, UA: NEGATIVE
Leukocytes, UA: NEGATIVE
Nitrite, UA: NEGATIVE
Urobilinogen, UA: 0.2

## 2012-07-23 NOTE — Assessment & Plan Note (Signed)
Trace seen on dip repeatedly but none on POC micro.. Will send to lab for micro for verification.

## 2012-07-23 NOTE — Assessment & Plan Note (Signed)
Sister with stomach cancer, passed away from this at young age. Likely due to gastritis vs. PUD, vs pancreatitis. Less liekly cancer or gallstones. Eval with  Labs including Hpylori.

## 2012-07-23 NOTE — Patient Instructions (Addendum)
Start prilosec 2 tabs of 20 mg daily. Avoid citris, tomato, caffeine, alcohol, peppermint, spicy foods, greasy foods. Small meals, don't eat late. Stop by labs on your way out. We will call you with the results.  Make follow up apt in 2 weeks follow up abdominal pain, epigastric and right lower quadrant.Marland Kitchen

## 2012-07-23 NOTE — Assessment & Plan Note (Signed)
?   Endometriosis, doubt appendicitis, no ovary or uterus. Less likely kidney stone.

## 2012-07-23 NOTE — Progress Notes (Signed)
Subjective:    Patient ID: Ashley Lin, female    DOB: 11-27-1955, 56 y.o.   MRN: [id number]  HPI  56 year old female with history of endometriosis, s/p abdominal hysterectomy (has left ovary) presents with  right lower abdominal pain. 10/10 pain yesterday. Pain is constant but intermittantly worse. Has been ongoing for 7 months. Pain has gradually worsen in last few weeks. Nothing makes it worse, no change with movement or food. Not any better with BM or foods.Took old Vicodin she had which helped. Occ pain wakes her up while sleeping. No Diarrhea/Constipation currently, has had slight constipation in past. No dysuria. No N/V.  She is sexually active, one partner.  She also had some upper abdominal pain triggered by greasy food soda, alcohol, associated with nausea. occ heartburn and reflux. Pepcid helped This pain is occuring daily.  Sister with stomach cancer, passed away in last year... She is worried about this. Father with bladder cancer.   Seen in urgent care in 11/2011 UA showed trace blood, thought ? kidney stones. No imaging done at that point. Has been told at GYN she has had blood in urine but never had work up.  On estrogen patch   Last pelvic exam with Dr. Rana Snare in 2013, nml pap smear.  Nml colonoscopy in 2011 with Dr. Dulce Sellar at [place]. Review of Systems  Constitutional: Negative for fever and fatigue.  HENT: Negative for ear pain.   Eyes: Negative for pain.  Respiratory: Negative for chest tightness and shortness of breath.   Cardiovascular: Negative for chest pain, palpitations and leg swelling.  Gastrointestinal: Positive for nausea, abdominal pain and constipation. Negative for vomiting, diarrhea, blood in stool, abdominal distention and rectal pain.  Genitourinary: Negative for dysuria.       Objective:   Physical Exam  Constitutional: Vital signs are normal. She appears well-developed and well-nourished. She is cooperative.  Non-toxic appearance. She does  not appear ill. No distress.  HENT:  Head: Normocephalic.  Right Ear: Hearing, tympanic membrane, external ear and ear canal normal. Tympanic membrane is not erythematous, not retracted and not bulging.  Left Ear: Hearing, tympanic membrane, external ear and ear canal normal. Tympanic membrane is not erythematous, not retracted and not bulging.  Nose: No mucosal edema or rhinorrhea. Right sinus exhibits no maxillary sinus tenderness and no frontal sinus tenderness. Left sinus exhibits no maxillary sinus tenderness and no frontal sinus tenderness.  Mouth/Throat: Uvula is midline, oropharynx is clear and moist and mucous membranes are normal.  Eyes: Conjunctivae normal, EOM and lids are normal. Pupils are equal, round, and reactive to light. No foreign bodies found.  Neck: Trachea normal and normal range of motion. Neck supple. Carotid bruit is not present. No mass and no thyromegaly present.  Cardiovascular: Normal rate, regular rhythm, S1 normal, S2 normal, normal heart sounds, intact distal pulses and normal pulses.  Exam reveals no gallop and no friction rub.   No murmur heard. Pulmonary/Chest: Effort normal and breath sounds normal. Not tachypneic. No respiratory distress. She has no decreased breath sounds. She has no wheezes. She has no rhonchi. She has no rales.  Abdominal: Soft. Normal appearance and bowel sounds are normal. There is no hepatosplenomegaly. There is tenderness in the right lower quadrant and epigastric area. There is no rigidity, no rebound, no guarding, no CVA tenderness and negative Murphy's sign. No hernia.  Neurological: She is alert.  Skin: Skin is warm, dry and intact. No rash noted.  Psychiatric: Her speech is normal  and behavior is normal. Judgment and thought content normal. Her mood appears not anxious. Cognition and memory are normal. She does not exhibit a depressed mood.          Assessment & Plan:

## 2012-07-24 LAB — COMPREHENSIVE METABOLIC PANEL
ALT: 28 U/L (ref 0–35)
AST: 36 U/L (ref 0–37)
Albumin: 4.2 g/dL (ref 3.5–5.2)
Alkaline Phosphatase: 44 U/L (ref 39–117)
BUN: 17 mg/dL (ref 6–23)
Creatinine, Ser: 1.1 mg/dL (ref 0.4–1.2)
Potassium: 5.1 mEq/L (ref 3.5–5.1)

## 2012-07-24 LAB — URINALYSIS, MICROSCOPIC ONLY
Bacteria, UA: NONE SEEN
Casts: NONE SEEN

## 2012-07-28 NOTE — Addendum Note (Signed)
Addended by: Kerby Nora E on: 07/28/2012 02:16 AM   Modules accepted: Orders

## 2012-07-29 ENCOUNTER — Ambulatory Visit (INDEPENDENT_AMBULATORY_CARE_PROVIDER_SITE_OTHER)
Admission: RE | Admit: 2012-07-29 | Discharge: 2012-07-29 | Disposition: A | Discharge status: Home / Self Care | Payer: 59 | Source: Ambulatory Visit | Attending: Family Medicine | Admitting: Family Medicine

## 2012-07-29 DIAGNOSIS — R1031 Right lower quadrant pain: Secondary | ICD-10-CM

## 2012-07-29 DIAGNOSIS — R1013 Epigastric pain: Secondary | ICD-10-CM

## 2012-07-29 DIAGNOSIS — R3129 Other microscopic hematuria: Secondary | ICD-10-CM

## 2012-07-31 ENCOUNTER — Telehealth: Payer: Self-pay | Admitting: Family Medicine

## 2012-07-31 DIAGNOSIS — R319 Hematuria, unspecified: Secondary | ICD-10-CM

## 2012-07-31 MED ORDER — TRAMADOL HCL 50 MG PO TABS: 50.0000 mg | ORAL_TABLET | Freq: Three times a day (TID) | ORAL | Status: DC | PRN

## 2012-07-31 NOTE — Telephone Encounter (Signed)
Message copied by Excell Seltzer on Fri Jul 31, 2012 12:44 AM ------      Message from: Baldomero Lamy      Created: Thu Jul 30, 2012 10:43 AM       Spoke with pt , she is agreable to uro referral and is ok with seeing Dr in [place]. She also says the prilosec 2 tabs has helped a lot, but she is still having back and flank pain. She is requesting a rx for a pain med called in to [facility] in [place]

## 2012-08-07 ENCOUNTER — Encounter: Payer: Self-pay | Admitting: Family Medicine

## 2012-08-07 ENCOUNTER — Ambulatory Visit (INDEPENDENT_AMBULATORY_CARE_PROVIDER_SITE_OTHER): Payer: 59 | Admitting: Family Medicine

## 2012-08-07 VITALS — BP 110/70 | HR 72 | Temp 97.9°F | Ht 65.5 in | Wt 152.8 lb

## 2012-08-07 DIAGNOSIS — R3129 Other microscopic hematuria: Secondary | ICD-10-CM

## 2012-08-07 DIAGNOSIS — R1013 Epigastric pain: Secondary | ICD-10-CM

## 2012-08-07 DIAGNOSIS — R1031 Right lower quadrant pain: Secondary | ICD-10-CM

## 2012-08-07 NOTE — Assessment & Plan Note (Signed)
Father with bladder cancer, unclear source of hematuria, no stones, no UTI.. referral to uro for possible cystoscopy etc.

## 2012-08-07 NOTE — Patient Instructions (Addendum)
Set up appt with Dr. Rana Snare regarding right lower quadrant pain, ? Endometriosis.  Continue prilosec 40 mg, but call if epigastric pain returns and/or if Dr. Rana Snare think right lower abdominal pain not due to endometriosis for referral to GI.

## 2012-08-07 NOTE — Assessment & Plan Note (Signed)
CT scan showed no abnormality.. this was a noncontrast CT given at the time we were looking for renal stone.  Pain is very low down.. ? Of endometriosis on abdominal wall, she no longer has ovary present on right. She has extensive history of endometriosis in past with multiple laproscopic surgeries.  Also GI source is possible, but she has no clear consistent D/C and she hasd nml colonoscopy in 2011.  Next step is eval by GYN (Dr. Rana Snare) for to rule in/out endometriosis as source of pain, depending on this we will consider referral to GI>

## 2012-08-07 NOTE — Progress Notes (Signed)
Subjective:    Patient ID: Ashley Lin, female    DOB: 1956-09-24, 56 y.o.   MRN: [id number]  HPI 56 year old female with history of endometriosis, s/p abdominal hysterectomy (has left ovary) presents with for follow up of epigastric pain, hematuria and  right lower abdominal pain.   1. Epigastric pain: neg CMET, Hpylori, neg lipase Improved significantly on prilosec 40 mg daily.  Sister with stomach cancer. Slight changes seen in esphagus on CT as below  2. Hematuria: persistant on UA micro. No sign of renal stones. Father with bladder cancer.  Has referral to urology pending on 08/25/2012  3. Right lower abdominal pain: No source of pain seen on CT scan.  Since last week pain seems to radiate to right lower back and down right posterior thigh. Some occ diarrhea in last day, no constipaation, No blood in stool. Some nausea. Tramadol helps some with pain. CT abd pelvis without contrast IMPRESSION:  No renal or ureteral calculi or findings to suggest renal  collecting system obstruction.  Slight thickening of the distal esophagus may be related to under  distension. Mucosal abnormality not entirely excluded.  Last pelvic exam with Dr. Rana Snare in 2013, nml pap smear.  Nml colonoscopy in 2011 with Dr. Dulce Sellar at [place].  Has been having cold symptoms low grade temp of 100 in last week. Some body ache and chills.    Review of Systems  Constitutional: Negative for fever and fatigue.  HENT: Negative for ear pain.   Eyes: Negative for pain.  Respiratory: Negative for chest tightness and shortness of breath.   Cardiovascular: Negative for chest pain, palpitations and leg swelling.  Gastrointestinal: Positive for abdominal pain. Negative for constipation, blood in stool and abdominal distention.  Genitourinary: Negative for dysuria.       Objective:   Physical Exam  Constitutional: Vital signs are normal. She appears well-developed and well-nourished. She is cooperative.   Non-toxic appearance. She does not appear ill. No distress.  HENT:  Head: Normocephalic.  Right Ear: Hearing, tympanic membrane, external ear and ear canal normal. Tympanic membrane is not erythematous, not retracted and not bulging.  Left Ear: Hearing, tympanic membrane, external ear and ear canal normal. Tympanic membrane is not erythematous, not retracted and not bulging.  Nose: No mucosal edema or rhinorrhea. Right sinus exhibits no maxillary sinus tenderness and no frontal sinus tenderness. Left sinus exhibits no maxillary sinus tenderness and no frontal sinus tenderness.  Mouth/Throat: Uvula is midline, oropharynx is clear and moist and mucous membranes are normal.  Eyes: Conjunctivae normal, EOM and lids are normal. Pupils are equal, round, and reactive to light. No foreign bodies found.  Neck: Trachea normal and normal range of motion. Neck supple. Carotid bruit is not present. No mass and no thyromegaly present.  Cardiovascular: Normal rate, regular rhythm, S1 normal, S2 normal, normal heart sounds, intact distal pulses and normal pulses.  Exam reveals no gallop and no friction rub.   No murmur heard. Pulmonary/Chest: Effort normal and breath sounds normal. Not tachypneic. No respiratory distress. She has no decreased breath sounds. She has no wheezes. She has no rhonchi. She has no rales.  Abdominal: Soft. Normal appearance and bowel sounds are normal. There is no hepatosplenomegaly. There is tenderness in the right lower quadrant. There is no rigidity, no rebound, no guarding, no CVA tenderness and negative Murphy's sign. No hernia.         No epigastric ttp this exam  pain in abdomen is very low  on right abdomen  Neurological: She is alert.  Skin: Skin is warm, dry and intact. No rash noted.  Psychiatric: Her speech is normal and behavior is normal. Judgment and thought content normal. Her mood appears not anxious. Cognition and memory are normal. She does not exhibit a depressed mood.           Assessment & Plan:

## 2012-08-07 NOTE — Assessment & Plan Note (Signed)
Likely due to gastritis. No evidence of PUD, Hpylori neg,no pancreatitis as lipase neg.  Improved significantly with diet and 40 mg daily of prilosec. Sister does have stomach cancer, also ? Thickening in distal esophagus likely due to poor distension, but may need to consider GI referral and endo if not continuing to improve.

## 2012-09-03 ENCOUNTER — Other Ambulatory Visit: Payer: Self-pay | Admitting: Family Medicine

## 2013-02-17 ENCOUNTER — Other Ambulatory Visit: Payer: Self-pay | Admitting: Family Medicine

## 2013-02-18 NOTE — Telephone Encounter (Signed)
What is pt using tramadol for at this time ... Abdominal pain? If so she may need re-eval if this is continuing. Let me know.

## 2013-02-19 NOTE — Telephone Encounter (Signed)
Left message for patient to return my call.

## 2013-11-09 ENCOUNTER — Encounter: Payer: Self-pay | Admitting: Family Medicine

## 2013-11-09 ENCOUNTER — Ambulatory Visit (INDEPENDENT_AMBULATORY_CARE_PROVIDER_SITE_OTHER): Payer: 59 | Admitting: Family Medicine

## 2013-11-09 VITALS — BP 114/66 | HR 70 | Temp 97.8°F | Ht 63.75 in | Wt 164.0 lb

## 2013-11-09 DIAGNOSIS — F988 Other specified behavioral and emotional disorders with onset usually occurring in childhood and adolescence: Secondary | ICD-10-CM

## 2013-11-09 DIAGNOSIS — Z13220 Encounter for screening for lipoid disorders: Secondary | ICD-10-CM

## 2013-11-09 DIAGNOSIS — J309 Allergic rhinitis, unspecified: Secondary | ICD-10-CM

## 2013-11-09 DIAGNOSIS — R1031 Right lower quadrant pain: Secondary | ICD-10-CM

## 2013-11-09 LAB — COMPREHENSIVE METABOLIC PANEL
ALBUMIN: 4.6 g/dL (ref 3.5–5.2)
ALT: 23 U/L (ref 0–35)
AST: 24 U/L (ref 0–37)
Alkaline Phosphatase: 54 U/L (ref 39–117)
BUN: 12 mg/dL (ref 6–23)
CALCIUM: 10 mg/dL (ref 8.4–10.5)
CHLORIDE: 105 meq/L (ref 96–112)
CO2: 28 meq/L (ref 19–32)
CREATININE: 0.9 mg/dL (ref 0.4–1.2)
GFR: 68.13 mL/min (ref >60.00)
GLUCOSE: 85 mg/dL (ref 70–99)
POTASSIUM: 4.9 meq/L (ref 3.5–5.1)
Sodium: 140 mEq/L (ref 135–145)
Total Bilirubin: 0.6 mg/dL (ref 0.3–1.2)
Total Protein: 8.1 g/dL (ref 6.0–8.3)

## 2013-11-09 LAB — LIPID PANEL
CHOLESTEROL: 213 mg/dL — AB (ref 0–200)
HDL: 62.3 mg/dL (ref >39.00)
TRIGLYCERIDES: 111 mg/dL (ref 0.0–149.0)
Total CHOL/HDL Ratio: 3
VLDL: 22.2 mg/dL (ref 0.0–40.0)

## 2013-11-09 LAB — LDL CHOLESTEROL, DIRECT: Direct LDL: 130.9 mg/dL

## 2013-11-09 MED ORDER — TRAMADOL HCL 50 MG PO TABS: ORAL_TABLET | ORAL | Status: DC

## 2013-11-09 NOTE — Assessment & Plan Note (Signed)
Trial of zyrtec.  

## 2013-11-09 NOTE — Progress Notes (Signed)
Pre-visit discussion using our clinic review tool. No additional management support is needed unless otherwise documented below in the visit note.  

## 2013-11-09 NOTE — Patient Instructions (Addendum)
Try zyrtec at bedtime for allergy symptoms.  Work on [employer] and [employer]healthy eating and regular exercise.

## 2013-11-09 NOTE — Addendum Note (Signed)
Addended by: Alvina ChouWALSH, Torrez Renfroe J on: 11/09/2013 03:19 PM   Modules accepted: Orders

## 2013-11-09 NOTE — Assessment & Plan Note (Signed)
Discussed dx. Offered referral to [facility].. Pt refused.

## 2013-11-09 NOTE — Progress Notes (Signed)
Subjective:    Patient ID: Ashley Lin, female    DOB: 08/18/1956, 58 y.o.   MRN: [id number]  Sore Throat  Pertinent negatives include no abdominal pain, ear pain or shortness of breath.    The patient is here for annual wellness exam and preventative care.     Epigastric pain in past improved with prilosec 40 mg every few days.   Endometriosis per GYN likely causing RLQ pain: considering laprascope to eval. Not bothering her too much. Using tramadol prn... Rarely needing but wants refill.   She has been having congestion, sore throat.. Off and on over last months.  No fever.  Benadryl helps some.  She has not tried zyrtec .  She does not like nasal spray.  ? ADD, trouble focusing. No depression, no anxiety. Mom said she had issues growing up. Not interested in referral to psych for eval.  Due for chol and DM screen.  Review of Systems  Constitutional: Negative for fever and fatigue.  HENT: Negative for ear pain.   Eyes: Negative for pain.  Respiratory: Negative for chest tightness and shortness of breath.   Cardiovascular: Negative for chest pain, palpitations and leg swelling.  Gastrointestinal: Negative for abdominal pain.  Genitourinary: Negative for dysuria.       Objective:   Physical Exam  Constitutional: Vital signs are normal. She appears well-developed and well-nourished. She is cooperative.  Non-toxic appearance. She does not appear ill. No distress.  HENT:  Head: Normocephalic.  Right Ear: Hearing, tympanic membrane, external ear and ear canal normal.  Left Ear: Hearing, tympanic membrane, external ear and ear canal normal.  Nose: Mucosal edema present.  pale nasal tubrbinates  Eyes: Conjunctivae, EOM and lids are normal. Pupils are equal, round, and reactive to light. Lids are everted and swept, no foreign bodies found.  Neck: Trachea normal and normal range of motion. Neck supple. Carotid bruit is not present. No mass and no thyromegaly present.   Cardiovascular: Normal rate, regular rhythm, S1 normal, S2 normal, normal heart sounds and intact distal pulses.  Exam reveals no gallop.   No murmur heard. Pulmonary/Chest: Effort normal and breath sounds normal. No respiratory distress. She has no wheezes. She has no rhonchi. She has no rales.  Abdominal: Soft. Normal appearance and bowel sounds are normal. She exhibits no distension, no fluid wave, no abdominal bruit and no mass. There is no hepatosplenomegaly. There is no tenderness. There is no rebound, no guarding and no CVA tenderness. No hernia.  Lymphadenopathy:    She has no cervical adenopathy.    She has no axillary adenopathy.  Neurological: She is alert. She has normal strength. No cranial nerve deficit or sensory deficit.  Skin: Skin is warm, dry and intact. No rash noted.  Psychiatric: Her speech is normal and behavior is normal. Judgment normal. Her mood appears not anxious. Cognition and memory are normal. She does not exhibit a depressed mood.          Assessment & Plan:  The patient's preventative maintenance and recommended screening tests for an annual wellness exam were reviewed in full today. Brought up to date unless services declined.  Counselled on the importance of diet, exercise, and its role in overall health and mortality. The patient's FH and SH was reviewed, including their home life, tobacco status, and drug and alcohol status.   Vaccines:uptodate with td. PAP/DVE: per GYN 12/2013 Colon:2011 nml, repeat 5 years given family hx of polyps Mammo:per GYN DEXA: no family hx  of osteoporosis,low risk, on estrogen. Plan on age 54.

## 2013-11-10 ENCOUNTER — Encounter: Payer: Self-pay | Admitting: *Deleted

## 2014-06-03 ENCOUNTER — Ambulatory Visit: Payer: 59 | Admitting: Family Medicine

## 2014-06-10 ENCOUNTER — Ambulatory Visit (INDEPENDENT_AMBULATORY_CARE_PROVIDER_SITE_OTHER): Payer: 59 | Admitting: Family Medicine

## 2014-06-10 ENCOUNTER — Encounter: Payer: Self-pay | Admitting: Family Medicine

## 2014-06-10 ENCOUNTER — Encounter (INDEPENDENT_AMBULATORY_CARE_PROVIDER_SITE_OTHER): Payer: Self-pay

## 2014-06-10 VITALS — BP 100/66 | HR 82 | Temp 97.9°F | Ht 63.75 in | Wt 148.5 lb

## 2014-06-10 DIAGNOSIS — M6740 Ganglion, unspecified site: Secondary | ICD-10-CM

## 2014-06-10 DIAGNOSIS — M67449 Ganglion, unspecified hand: Secondary | ICD-10-CM | POA: Insufficient documentation

## 2014-06-10 NOTE — Assessment & Plan Note (Signed)
Refer to hand specialist for aspiration or removal.

## 2014-06-10 NOTE — Progress Notes (Signed)
   Subjective:    Patient ID: Ashley Lin, female    DOB: September 21, 1956, 58 y.o.   MRN: [id number]  HPI   58 year old female present with new onset nodule on right 2nd digit x for several months. Nodule and joint at DIP is tender if she hits it. No heat.  No discharges. No fever. She has noted also in 3rd and 4th digits nodules appearing.   She has tenderness in joints Bilaterally, worse on right. No other joint issues.  Review of Systems  Constitutional: Negative for fever and fatigue.  HENT: Negative for ear pain.   Eyes: Negative for pain.  Respiratory: Negative for chest tightness and shortness of breath.   Cardiovascular: Negative for chest pain, palpitations and leg swelling.  Gastrointestinal: Negative for abdominal pain.  Genitourinary: Negative for dysuria.       Objective:   Physical Exam  Constitutional: She appears well-developed.  Neck: Normal range of motion. Neck supple.  Cardiovascular: Normal rate.   Pulmonary/Chest: Effort normal and breath sounds normal.  Abdominal: Soft. Bowel sounds are normal.  Musculoskeletal:  Right 2nd digit with 4 mm round red nodule, appears fluid filled, clear, non tender to palpation. B PIP joints with deformity, Heberden's nodules          Assessment & Plan:

## 2014-06-10 NOTE — Progress Notes (Signed)
Pre visit review using our clinic review tool, if applicable. No additional management support is needed unless otherwise documented below in the visit note. 

## 2014-06-10 NOTE — Patient Instructions (Signed)
Stop at front desk for referral. 

## 2015-04-20 ENCOUNTER — Telehealth: Payer: Self-pay

## 2015-04-20 NOTE — Telephone Encounter (Signed)
Left a message for patient to return my call about having a Mammogram set up. Will await call back.  

## 2015-07-07 ENCOUNTER — Encounter: Payer: Self-pay | Admitting: Family Medicine

## 2015-07-07 ENCOUNTER — Ambulatory Visit (INDEPENDENT_AMBULATORY_CARE_PROVIDER_SITE_OTHER): Payer: 59 | Admitting: Family Medicine

## 2015-07-07 VITALS — BP 98/62 | HR 103 | Temp 99.1°F | Ht 63.75 in | Wt 128.5 lb

## 2015-07-07 DIAGNOSIS — N10 Acute tubulo-interstitial nephritis: Secondary | ICD-10-CM | POA: Insufficient documentation

## 2015-07-07 DIAGNOSIS — E860 Dehydration: Secondary | ICD-10-CM

## 2015-07-07 DIAGNOSIS — R300 Dysuria: Secondary | ICD-10-CM

## 2015-07-07 LAB — POCT URINALYSIS DIP (MANUAL ENTRY)
Glucose, UA: NEGATIVE
Nitrite, UA: POSITIVE — AB
Spec Grav, UA: >=1.03
UROBILINOGEN UA: 4
pH, UA: 6

## 2015-07-07 MED ORDER — CIPROFLOXACIN HCL 500 MG PO TABS: 500.0000 mg | ORAL_TABLET | Freq: Two times a day (BID) | ORAL | Status: DC

## 2015-07-07 MED ORDER — FLUCONAZOLE 150 MG PO TABS: 150.0000 mg | ORAL_TABLET | Freq: Once | ORAL | Status: DC

## 2015-07-07 NOTE — Patient Instructions (Addendum)
Push fluids like water.  Stop aspirin. Use tylenol instead for fever.   Start oral antibitoics (cipro twice daily).. If unable to keep medication and fluids down, or fever continues on antibiotics, go to ER. We will call with urine results. If yeast infection symptoms... Use over the counter yeast cream until antibiotics are gone then take diflucan.   Pyelonephritis, Adult Pyelonephritis is a kidney infection. The kidneys are the organs that filter a person's blood and move waste out of the bloodstream and into the urine. Urine passes from the kidneys, through the ureters, and into the bladder. There are two main types of pyelonephritis:  Infections that come on quickly without any warning (acute pyelonephritis).  Infections that last for a long period of time (chronic pyelonephritis). In most cases, the infection clears up with treatment and does not cause further problems. More severe infections or chronic infections can sometimes spread to the bloodstream or lead to other problems with the kidneys. CAUSES This condition is usually caused by:  Bacteria traveling from the bladder to the kidney through infected urine. The urine in the bladder can become infected with bacteria from:  Bladder infection (cystitis).  Inflammation of the prostate gland (prostatitis).  Sexual intercourse, in females.  Bacteria traveling from the bloodstream to the kidney. RISK FACTORS This condition is more likely to develop in:  Pregnant women.  Older people.  People who have diabetes.  People who have kidney stones or bladder stones.  People who have other abnormalities of the kidney or ureter.  People who have a catheter placed in the bladder.  People who have cancer.  People who are sexually active.  Women who use spermicides.  People who have had a prior urinary tract infection. SYMPTOMS Symptoms of this condition include:  Frequent urination.  Strong or persistent urge to  urinate.  Burning or stinging when urinating.  Abdominal pain.  Back pain.  Pain in the side or flank area.  Fever.  Chills.  Blood in the urine, or dark urine.  Nausea.  Vomiting. DIAGNOSIS This condition may be diagnosed based on:  Medical history and physical exam.  Urine tests.  Blood tests. You may also have imaging tests of the kidneys, such as an ultrasound or CT scan. TREATMENT Treatment for this condition may depend on the severity of the infection.  If the infection is mild and is found early, you may be treated with antibiotic medicines taken by mouth. You will need to drink fluids to remain hydrated.  If the infection is more severe, you may need to stay in the hospital and receive antibiotics given directly into a vein through an IV tube. You may also need to receive fluids through an IV tube if you are not able to remain hydrated. After your hospital stay, you may need to take oral antibiotics for a period of time. Other treatments may be required, depending on the cause of the infection. HOME CARE INSTRUCTIONS Medicines  Take over-the-counter and prescription medicines only as told by your health care provider.  If you were prescribed an antibiotic medicine, take it as told by your health care provider. Do not stop taking the antibiotic even if you start to feel better. General Instructions  Drink enough fluid to keep your urine clear or pale yellow.  Avoid caffeine, tea, and carbonated beverages. They tend to irritate the bladder.  Urinate often. Avoid holding in urine for long periods of time.  Urinate before and after sex.  After a bowel  movement, women should cleanse from front to back. Use each tissue only once.  Keep all follow-up visits as told by your health care provider. This is important. SEEK MEDICAL CARE IF:  Your symptoms do not get better after 2 days of treatment.  Your symptoms get worse.  You have a fever. SEEK IMMEDIATE  MEDICAL CARE IF:  You are unable to take your antibiotics or fluids.  You have shaking chills.  You vomit.  You have severe flank or back pain.  You have extreme weakness or fainting.   This information is not intended to replace advice given to you by your health care provider. Make sure you discuss any questions you have with your health care provider.   Document Released: 09/16/2005 Document Revised: 06/07/2015 Document Reviewed: 01/09/2015 Elsevier Interactive Patient Education [employer]

## 2015-07-07 NOTE — Assessment & Plan Note (Signed)
Non nausea able to take po. Pt will increase water intake.

## 2015-07-07 NOTE — Progress Notes (Signed)
   Subjective:    Patient ID: Ashley Lin, female    DOB: Jul 21, 1956, 59 y.o.   MRN: [id number]  59 year old female pt with history of persistent microscopic hematuria presents with new onset fever, body aches and dysuria.   Dysuria  This is a new problem. The current episode started in the past 7 days (in last 3 days, fever sarted wednesday. She did recieve flu shot on tuesday.). The problem has been unchanged. The quality of the pain is described as burning. The pain is moderate. The maximum temperature recorded prior to her arrival was 103 - 104 F. The fever has been present for 3 - 4 days. Associated symptoms include hematuria, urgency and vomiting. Pertinent negatives include no flank pain, frequency, hesitancy or nausea. Associated symptoms comments: Myalgia  small emesis this AM, she has been drinking liquids and keeps it down . She has tried increased fluids (AZo, cranberry juice and water, using B/C) for the symptoms.      Does have some stomach ache after taking aspirin on empty stomach.  Review of Systems  Constitutional: Positive for fatigue.  HENT: Negative for ear pain.   Eyes: Negative for pain.  Respiratory: Negative for shortness of breath.   Cardiovascular: Negative for chest pain.  Gastrointestinal: Positive for vomiting. Negative for nausea.  Genitourinary: Positive for dysuria, urgency and hematuria. Negative for hesitancy, frequency and flank pain.       Objective:   Physical Exam  Constitutional: Vital signs are normal. She appears well-developed and well-nourished. She is cooperative.  Non-toxic appearance. She does not have a sickly appearance. She appears ill. No distress.  HENT:  Head: Normocephalic.  Right Ear: Hearing, tympanic membrane, external ear and ear canal normal. Tympanic membrane is not erythematous, not retracted and not bulging.  Left Ear: Hearing, tympanic membrane, external ear and ear canal normal. Tympanic membrane is not erythematous,  not retracted and not bulging.  Nose: No mucosal edema or rhinorrhea. Right sinus exhibits no maxillary sinus tenderness and no frontal sinus tenderness. Left sinus exhibits no maxillary sinus tenderness and no frontal sinus tenderness.  Mouth/Throat: Uvula is midline, oropharynx is clear and moist and mucous membranes are normal.  Eyes: Conjunctivae, EOM and lids are normal. Pupils are equal, round, and reactive to light. Lids are everted and swept, no foreign bodies found.  Neck: Trachea normal and normal range of motion. Neck supple. Carotid bruit is not present. No thyroid mass and no thyromegaly present.  Cardiovascular: Regular rhythm, S1 normal, S2 normal, normal heart sounds, intact distal pulses and normal pulses.  Tachycardia present.  Exam reveals no gallop and no friction rub.   No murmur heard. Pulmonary/Chest: Effort normal and breath sounds normal. No tachypnea. No respiratory distress. She has no decreased breath sounds. She has no wheezes. She has no rhonchi. She has no rales.  Abdominal: Soft. Normal appearance and bowel sounds are normal. There is no hepatosplenomegaly. There is no tenderness. There is no CVA tenderness.  Neurological: She is alert.  Skin: Skin is warm, dry and intact. No rash noted.  Psychiatric: Her speech is normal and behavior is normal. Judgment and thought content normal. Her mood appears not anxious. Cognition and memory are normal. She does not exhibit a depressed mood.          Assessment & Plan:

## 2015-07-07 NOTE — Progress Notes (Signed)
Pre visit review using our clinic review tool, if applicable. No additional management support is needed unless otherwise documented below in the visit note. 

## 2015-07-07 NOTE — Assessment & Plan Note (Signed)
Tolerating po. Push fluids.. treat with high dose cipro x 10 days.

## 2015-07-10 LAB — URINE CULTURE

## 2015-10-20 ENCOUNTER — Other Ambulatory Visit: Payer: Self-pay | Admitting: Obstetrics and Gynecology

## 2015-10-20 DIAGNOSIS — N644 Mastodynia: Secondary | ICD-10-CM

## 2015-10-25 ENCOUNTER — Ambulatory Visit
Admission: RE | Admit: 2015-10-25 | Discharge: 2015-10-25 | Disposition: A | Discharge status: Home / Self Care | Payer: 59 | Source: Ambulatory Visit | Attending: Obstetrics and Gynecology | Admitting: Obstetrics and Gynecology

## 2015-10-25 ENCOUNTER — Other Ambulatory Visit: Payer: Self-pay | Admitting: Obstetrics and Gynecology

## 2015-10-25 DIAGNOSIS — N644 Mastodynia: Secondary | ICD-10-CM

## 2016-04-03 LAB — HM MAMMOGRAPHY: HM MAMMO: NORMAL (ref 0–4)

## 2016-04-03 LAB — HM PAP SMEAR: HM Pap smear: NEGATIVE

## 2016-05-01 ENCOUNTER — Encounter: Payer: Self-pay | Admitting: Family Medicine

## 2016-05-01 ENCOUNTER — Ambulatory Visit (INDEPENDENT_AMBULATORY_CARE_PROVIDER_SITE_OTHER): Payer: 59 | Admitting: Family Medicine

## 2016-05-01 ENCOUNTER — Ambulatory Visit: Payer: 59 | Admitting: Family Medicine

## 2016-05-01 VITALS — BP 102/60 | HR 72 | Temp 98.4°F | Ht 63.75 in | Wt 133.0 lb

## 2016-05-01 DIAGNOSIS — Z800 Family history of malignant neoplasm of digestive organs: Secondary | ICD-10-CM | POA: Insufficient documentation

## 2016-05-01 DIAGNOSIS — R1013 Epigastric pain: Secondary | ICD-10-CM

## 2016-05-01 DIAGNOSIS — K219 Gastro-esophageal reflux disease without esophagitis: Secondary | ICD-10-CM | POA: Diagnosis not present

## 2016-05-01 NOTE — Assessment & Plan Note (Signed)
Likely recurrent gastritis, restart  PPI.  Will hold on labs given CMET, Hpylori and lipase neg for similar symptoms in past.  If not improving will consider repeating. With sister history, pt feeling of food getting caught in stomach and recurrent symptoms.. Will also refer to GI for possible endoscopy.

## 2016-05-01 NOTE — Progress Notes (Signed)
   Subjective:    Patient ID: Ashley Lin, female    DOB: Nov 18, 1955, 60 y.o.   MRN: [id number]  HPI    60 year old female presents with continued epigastric pain, returned in last year worse in last 2 months.  In past in 2012 gastritis.. prilosec 40 mg had improved pain. At that time  CT abd pain:  Neg, lipase and HPylori neg in 2013.  She has been using prilosec only intermittently in last 1-2 years.   Pain after eating fried foods, raw cucumbers.  Occ acid in throat,  No burping occ sour taste in mouth.  Occ feels like food getting stuck in stomach. No dysphagia.  Stomach cancer in sister.  Social History /Family History/Past Medical History reviewed and updated if needed.   Review of Systems  Constitutional: Negative for fatigue and fever.  HENT: Negative for ear pain.   Eyes: Negative for pain.  Respiratory: Negative for chest tightness and shortness of breath.   Cardiovascular: Negative for chest pain, palpitations and leg swelling.  Gastrointestinal: Negative for abdominal pain.  Genitourinary: Negative for dysuria.       Objective:   Physical Exam  Constitutional: Vital signs are normal. She appears well-developed and well-nourished. She is cooperative.  Non-toxic appearance. She does not appear ill. No distress.  HENT:  Head: Normocephalic.  Right Ear: Hearing, tympanic membrane, external ear and ear canal normal. Tympanic membrane is not erythematous, not retracted and not bulging.  Left Ear: Hearing, tympanic membrane, external ear and ear canal normal. Tympanic membrane is not erythematous, not retracted and not bulging.  Nose: No mucosal edema or rhinorrhea. Right sinus exhibits no maxillary sinus tenderness and no frontal sinus tenderness. Left sinus exhibits no maxillary sinus tenderness and no frontal sinus tenderness.  Mouth/Throat: Uvula is midline, oropharynx is clear and moist and mucous membranes are normal.  Eyes: Conjunctivae, EOM and lids are  normal. Pupils are equal, round, and reactive to light. Lids are everted and swept, no foreign bodies found.  Neck: Trachea normal and normal range of motion. Neck supple. Carotid bruit is not present. No thyroid mass and no thyromegaly present.  Cardiovascular: Normal rate, regular rhythm, S1 normal, S2 normal, normal heart sounds, intact distal pulses and normal pulses.  Exam reveals no gallop and no friction rub.   No murmur heard. Pulmonary/Chest: Effort normal and breath sounds normal. No tachypnea. No respiratory distress. She has no decreased breath sounds. She has no wheezes. She has no rhonchi. She has no rales.  Abdominal: Soft. Normal appearance and bowel sounds are normal. There is no hepatosplenomegaly. There is tenderness in the epigastric area. There is rebound. There is no rigidity, no guarding and no CVA tenderness. No hernia.  Neurological: She is alert.  Skin: Skin is warm, dry and intact. No rash noted.  Psychiatric: Her speech is normal and behavior is normal. Judgment and thought content normal. Her mood appears not anxious. Cognition and memory are normal. She does not exhibit a depressed mood.          Assessment & Plan:

## 2016-05-01 NOTE — Progress Notes (Signed)
Pre visit review using our clinic review tool, if applicable. No additional management support is needed unless otherwise documented below in the visit note.[id number]

## 2016-05-01 NOTE — Patient Instructions (Signed)
Start back on prilosec 2 x 20 mg tabs daily. Avoid any acidic foods or trigger foods. Stop at front desk for a referral for GI.

## 2016-05-02 ENCOUNTER — Encounter: Payer: 59 | Admitting: Family Medicine

## 2016-05-22 ENCOUNTER — Encounter: Payer: Self-pay | Admitting: Family Medicine

## 2017-01-23 ENCOUNTER — Encounter: Payer: Self-pay | Admitting: Family Medicine

## 2017-01-23 ENCOUNTER — Ambulatory Visit (INDEPENDENT_AMBULATORY_CARE_PROVIDER_SITE_OTHER): Payer: 59 | Admitting: Family Medicine

## 2017-01-23 VITALS — BP 120/80 | HR 86 | Temp 98.4°F | Ht 63.75 in | Wt 137.5 lb

## 2017-01-23 DIAGNOSIS — M779 Enthesopathy, unspecified: Principal | ICD-10-CM

## 2017-01-23 DIAGNOSIS — M778 Other enthesopathies, not elsewhere classified: Secondary | ICD-10-CM

## 2017-01-23 DIAGNOSIS — M7750 Other enthesopathy of unspecified foot: Secondary | ICD-10-CM | POA: Diagnosis not present

## 2017-01-23 MED ORDER — PREDNISONE 20 MG PO TABS: ORAL_TABLET | ORAL | 0 refills | Status: DC

## 2017-01-23 NOTE — Progress Notes (Signed)
Pre visit review using our clinic review tool, if applicable. No additional management support is needed unless otherwise documented below in the visit note. 

## 2017-01-23 NOTE — Progress Notes (Signed)
Dr. Karleen Hampshire T. Jacob Cicero, MD, CAQ [facility] [address] [place], [address] Phone: [phone] Fax: [phone]  01/23/2017  Patient: Ashley Lin, MRN: [id number], DOB: Apr 12, 1956, 61 y.o.  Primary Physician:  Kerby Nora, MD   Chief Complaint  Patient presents with  . Foot Pain    Right x 1 month-No injury   Subjective:   Ashley Lin is a 61 y.o. very pleasant female patient who presents with the following:  Medial front foot on the R. Pain on the R.  No specific injury.  No swelling or bruising.  This is on the medial aspect of the MTP joint.  She has no history of gout or pseudogout.  There is no swelling, redness, or warmth.  No prior significant history.  She has tried some basic things at home such as anti-inflammatories and Tylenol.  She is that or not compressive in the forefoot are more comfortable.   Past Medical History, Surgical History, Social History, Family History, Problem List, Medications, and Allergies have been reviewed and updated if relevant.  Patient Active Problem List   Diagnosis Date Noted  . GERD (gastroesophageal reflux disease) 05/01/2016  . Family history of stomach cancer 05/01/2016  . Digital mucinous cyst 06/10/2014  . Allergic rhinitis 11/09/2013  . ? of ADD (attention deficit disorder) 11/09/2013  . Abdominal pain, epigastric 07/23/2012  . Microscopic hematuria, persistant 07/23/2012  . HX/o endometriosis 03/08/2008  . FATIGUE, CHRONIC 02/29/2008    No past medical history on file.  Past Surgical History:  Procedure Laterality Date  . ABDOMINAL HYSTERECTOMY      Social History   Social History  . Marital status: Single    Spouse name: N/A  . Number of children: N/A  . Years of education: N/A   Occupational History  . Not on file.   Social History Main Topics  . Smoking status: Never Smoker  . Smokeless tobacco: Never Used  . Alcohol use Yes     Comment: occasonal    . Drug use: No  . Sexual activity: Not on file   Other Topics Concern  . Not on file   Social History Narrative  . No narrative on file    No family history on file.  No Known Allergies  Medication list reviewed and updated in full in [facility].  GEN: No fevers, chills. Nontoxic. Primarily MSK c/o today. MSK: Detailed in the HPI GI: tolerating PO intake without difficulty Neuro: No numbness, parasthesias, or tingling associated. Otherwise the pertinent positives of the ROS are noted above.   Objective:   BP 120/80   Pulse 86   Temp 98.4 F (36.9 C) (Oral)   Ht 5' 3.75" (1.619 m)   Wt 137 lb 8 oz (62.4 kg)   BMI 23.79 kg/m    GEN: WDWN, NAD, Non-toxic, Alert & Oriented x 3 HEENT: Atraumatic, Normocephalic.  Ears and Nose: No external deformity. EXTR: No clubbing/cyanosis/edema NEURO: Normal gait.  PSYCH: Normally interactive. Conversant. Not depressed or anxious appearing.  Calm demeanor.   FEET: R Echymosis: no Edema: no ROM: full LE B Gait: heel toe, non-antalgic MT pain: no Callus pattern: none Lateral Mall: NT Medial Mall: NT Talus: NT Navicular: NT Cuboid: NT Calcaneous: NT Metatarsals: NT 5th MT: NT Phalanges: NT Achilles: NT Plantar Fascia: NT Fat Pad: NT Peroneals: NT Post Tib: NT Great Toe: Nml motion, mild tenderness with medial deviation Ant Drawer: neg ATFL: NT CFL: NT  Deltoid: NT Other foot breakdown: none Long arch: preserved Transverse arch: preserved Hindfoot breakdown: none Sensation: intact   Radiology: No results found.  Assessment and Plan:   Toe tendonitis  Work comfortable shoes.  Wider toe box.  Oral prednisone to see if we can get the tendinopathy to calm down.  Follow-up: No Follow-up on file.  Meds ordered this encounter  Medications  . amphetamine-dextroamphetamine (ADDERALL XR) 20 MG 24 hr capsule    Sig: Take 40 mg by mouth daily.   . predniSONE (DELTASONE) 20 MG tablet    Sig: 2 tabs po  daily for 1 week, then 1 tab po daily    Dispense:  15 tablet    Refill:  0   Medications Discontinued During This Encounter  Medication Reason  . amphetamine-dextroamphetamine (ADDERALL XR) 30 MG 24 hr capsule Change in therapy  . estradiol (VIVELLE-DOT) 0.05 MG/24HR Patient Preference    Signed,  Nialah Saravia T. Legrand Lasser, MD   Allergies as of 01/23/2017   No Known Allergies     Medication List       Accurate as of 01/23/17 11:59 PM. Always use your most recent med list.          ALPRAZolam 0.5 MG tablet Commonly known as:  XANAX TK 1 TO 2 TS PO BID PRN   amphetamine-dextroamphetamine 20 MG 24 hr capsule Commonly known as:  ADDERALL XR Take 40 mg by mouth daily.   predniSONE 20 MG tablet Commonly known as:  DELTASONE 2 tabs po daily for 1 week, then 1 tab po daily   traZODone 50 MG tablet Commonly known as:  DESYREL TK 1 TO 2 TS PO QHS   valACYclovir 1000 MG tablet Commonly known as:  VALTREX TK 1 T PO QD

## 2017-02-26 DIAGNOSIS — J209 Acute bronchitis, unspecified: Secondary | ICD-10-CM | POA: Diagnosis not present

## 2017-04-04 ENCOUNTER — Other Ambulatory Visit (INDEPENDENT_AMBULATORY_CARE_PROVIDER_SITE_OTHER): Payer: 59

## 2017-04-04 ENCOUNTER — Telehealth: Payer: Self-pay | Admitting: Family Medicine

## 2017-04-04 DIAGNOSIS — Z13220 Encounter for screening for lipoid disorders: Secondary | ICD-10-CM | POA: Diagnosis not present

## 2017-04-04 LAB — COMPREHENSIVE METABOLIC PANEL
ALT: 17 U/L (ref 0–35)
AST: 23 U/L (ref 0–37)
Albumin: 4.4 g/dL (ref 3.5–5.2)
Alkaline Phosphatase: 49 U/L (ref 39–117)
BUN: 18 mg/dL (ref 6–23)
CHLORIDE: 106 meq/L (ref 96–112)
CO2: 28 meq/L (ref 19–32)
Calcium: 10.1 mg/dL (ref 8.4–10.5)
Creatinine, Ser: 1 mg/dL (ref 0.40–1.20)
GFR: 60.37 mL/min (ref >60.00)
GLUCOSE: 100 mg/dL — AB (ref 70–99)
POTASSIUM: 4.7 meq/L (ref 3.5–5.1)
SODIUM: 140 meq/L (ref 135–145)
TOTAL PROTEIN: 7.3 g/dL (ref 6.0–8.3)
Total Bilirubin: 0.6 mg/dL (ref 0.2–1.2)

## 2017-04-04 LAB — LIPID PANEL
CHOL/HDL RATIO: 3
Cholesterol: 219 mg/dL — ABNORMAL HIGH (ref 0–200)
HDL: 84.1 mg/dL (ref >39.00)
LDL Cholesterol: 124 mg/dL — ABNORMAL HIGH (ref 0–99)
NONHDL: 134.64
Triglycerides: 54 mg/dL (ref 0.0–149.0)
VLDL: 10.8 mg/dL (ref 0.0–40.0)

## 2017-04-04 NOTE — Telephone Encounter (Signed)
-----   Message from Baldomero LamyNatasha C Chavers sent at 04/04/2017  8:49 AM EDT ----- Regarding: cpx labs today Please order  future cpx labs for pt's upcoming lab appt. Thanks Rodney Boozeasha

## 2017-04-10 ENCOUNTER — Ambulatory Visit (INDEPENDENT_AMBULATORY_CARE_PROVIDER_SITE_OTHER): Payer: 59 | Admitting: Family Medicine

## 2017-04-10 ENCOUNTER — Encounter: Payer: Self-pay | Admitting: Family Medicine

## 2017-04-10 VITALS — BP 110/70 | HR 68 | Temp 98.3°F | Ht 64.25 in | Wt 136.2 lb

## 2017-04-10 DIAGNOSIS — F988 Other specified behavioral and emotional disorders with onset usually occurring in childhood and adolescence: Secondary | ICD-10-CM

## 2017-04-10 DIAGNOSIS — Z0000 Encounter for general adult medical examination without abnormal findings: Secondary | ICD-10-CM

## 2017-04-10 DIAGNOSIS — Z1159 Encounter for screening for other viral diseases: Secondary | ICD-10-CM

## 2017-04-10 NOTE — Progress Notes (Signed)
Subjective:    Patient ID: Ashley Lin, female    DOB: 09/25/1956, 61 y.o.   MRN: [id number]  HPI   The patient is here for annual wellness exam and preventative care.   Sees GYN for pap/DVE. Will obtain last pap report.  ADD: currently on adderall 20 mg daily.  Followed and med prescibed by  Psych Dr. Evelene CroonKaur.   Resolved Hpylori infection S/P  Pylea.  only using prilosec prn now.  Reviewed labs in detail.  Borderline prediabetes: glucose 100.  Inadequate control on cholesterol.  LDL 124, HDL very good.  AHA risk calc 1.6 % risk of CVD in next 10 years. No indication for statin.   Exercise: none Diet:  Healthy, fruits and veggies,  some water  Social History  Substance Use Topics  . Smoking status: Never Smoker  . Smokeless tobacco: Never Used  . Alcohol use Yes     Comment: occasonal   Social History /Family History/Past Medical History reviewed in detail and updated in EMR if needed. Blood pressure 110/70, pulse 68, temperature 98.3 F (36.8 C), temperature source Oral, height 5' 4.25" (1.632 m), weight 136 lb 4 oz (61.8 kg).   Review of Systems  Constitutional: Negative for fatigue and fever.  HENT: Negative for congestion.   Eyes: Negative for pain.  Respiratory: Negative for cough and shortness of breath.   Cardiovascular: Negative for chest pain, palpitations and leg swelling.  Gastrointestinal: Negative for abdominal pain.  Genitourinary: Negative for dysuria and vaginal bleeding.  Musculoskeletal: Negative for back pain.  Neurological: Negative for syncope, light-headedness and headaches.  Psychiatric/Behavioral: Negative for dysphoric mood.       Objective:   Physical Exam  Constitutional: Vital signs are normal. She appears well-developed and well-nourished. She is cooperative.  Non-toxic appearance. She does not appear ill. No distress.  HENT:  Head: Normocephalic.  Right Ear: Hearing, tympanic membrane, external ear and ear canal normal.  Left  Ear: Hearing, tympanic membrane, external ear and ear canal normal.  Nose: Nose normal.  Eyes: Pupils are equal, round, and reactive to light. Conjunctivae, EOM and lids are normal. Lids are everted and swept, no foreign bodies found.  Neck: Trachea normal and normal range of motion. Neck supple. Carotid bruit is not present. No thyroid mass and no thyromegaly present.  Cardiovascular: Normal rate, regular rhythm, S1 normal, S2 normal, normal heart sounds and intact distal pulses.  Exam reveals no gallop.   No murmur heard. Pulmonary/Chest: Effort normal and breath sounds normal. No respiratory distress. She has no wheezes. She has no rhonchi. She has no rales.  Abdominal: Soft. Normal appearance and bowel sounds are normal. She exhibits no distension, no fluid wave, no abdominal bruit and no mass. There is no hepatosplenomegaly. There is no tenderness. There is no rebound, no guarding and no CVA tenderness. No hernia.  Lymphadenopathy:    She has no cervical adenopathy.    She has no axillary adenopathy.  Neurological: She is alert. She has normal strength. No cranial nerve deficit or sensory deficit.  Skin: Skin is warm, dry and intact. No rash noted.  Psychiatric: Her speech is normal and behavior is normal. Judgment normal. Her mood appears not anxious. Cognition and memory are normal. She does not exhibit a depressed mood.          Assessment & Plan:  The patient's preventative maintenance and recommended screening tests for an annual wellness exam were reviewed in full today. Brought up to date unless services  declined.  Counselled on the importance of diet, exercise, and its role in overall health and mortality. The patient's FH and SH was reviewed, including their home life, tobacco status, and drug and alcohol status.   Vaccines: uptodate Pap/DVE:  Per GYN Mammo:  Per GYN Bone Density: plan age 25-65 Colon: 04/2016, repeat in 5 years Dr. Dulce Sellar  Smoking Status: none ETOH/  drug use: occ/none  Hep C:   Will do today  HIV screen:   refused

## 2017-04-10 NOTE — Assessment & Plan Note (Signed)
Followed by Dr. Evelene CroonKaur. Stable control on  20 mg daily adderall.

## 2017-04-10 NOTE — Addendum Note (Signed)
Addended by: Alvina ChouWALSH, TERRI J on: 04/10/2017 12:45 PM   Modules accepted: Orders

## 2017-04-10 NOTE — Patient Instructions (Addendum)
Work on exercise 3-5 times a week.  Decrease carbohydrates in diet.. Try to use whole wheat pasta.   Please stop at the lab to have labs drawn.  Can try trail of glucosamine 500 mg 1-3 times daily.

## 2017-04-11 ENCOUNTER — Encounter: Payer: Self-pay | Admitting: *Deleted

## 2017-04-11 LAB — HEPATITIS C ANTIBODY: HCV AB: NEGATIVE

## 2017-04-22 ENCOUNTER — Encounter: Payer: Self-pay | Admitting: Family Medicine

## 2017-06-10 DIAGNOSIS — Z01419 Encounter for gynecological examination (general) (routine) without abnormal findings: Secondary | ICD-10-CM | POA: Diagnosis not present

## 2017-06-10 DIAGNOSIS — Z6824 Body mass index (BMI) 24.0-24.9, adult: Secondary | ICD-10-CM | POA: Diagnosis not present

## 2017-07-09 DIAGNOSIS — Z13820 Encounter for screening for osteoporosis: Secondary | ICD-10-CM | POA: Diagnosis not present

## 2017-10-30 ENCOUNTER — Telehealth: Payer: Self-pay | Admitting: Family Medicine

## 2017-10-30 NOTE — Telephone Encounter (Signed)
Okay with me 

## 2017-10-30 NOTE — Telephone Encounter (Signed)
Pt would like to switch pcp from dr Ermalene Searingbedsole to dr [place] Martes[place] Closer to her home  Is it ok to schedule

## 2017-10-31 NOTE — Telephone Encounter (Signed)
Okay with me.. No red flags. I think your office schedules it and contacts her now ... Right?

## 2017-10-31 NOTE — Telephone Encounter (Signed)
Yes okay to schedule.

## 2017-10-31 NOTE — Telephone Encounter (Signed)
I called pt/Ashley Lin said thank you and Ashley Lin will call back to schedule an appt for herself/Ashley Lin will be here Tuesday with her Granddaughter as well/thx dmf

## 2017-11-04 ENCOUNTER — Ambulatory Visit (INDEPENDENT_AMBULATORY_CARE_PROVIDER_SITE_OTHER): Payer: 59 | Admitting: Family Medicine

## 2017-11-04 ENCOUNTER — Encounter: Payer: Self-pay | Admitting: Family Medicine

## 2017-11-04 VITALS — BP 126/70 | HR 86 | Temp 98.2°F | Ht 64.25 in | Wt 142.8 lb

## 2017-11-04 DIAGNOSIS — R300 Dysuria: Secondary | ICD-10-CM

## 2017-11-04 LAB — POCT URINALYSIS DIPSTICK
BILIRUBIN UA: NEGATIVE
GLUCOSE UA: NEGATIVE
KETONES UA: NEGATIVE
Leukocytes, UA: NEGATIVE
Nitrite, UA: NEGATIVE
Protein, UA: NEGATIVE
Spec Grav, UA: >=1.03 — AB (ref 1.010–1.025)
UROBILINOGEN UA: 0.2 U/dL
pH, UA: 6 (ref 5.0–8.0)

## 2017-11-04 NOTE — Progress Notes (Signed)
SUBJECTIVE: Ashley AuerbachDeborah C Lin is a 62 y.o. female who complains of urinary frequency x 1 month, without dysuria, hematuria flank pain, fever, chills, or abnormal vaginal discharge or bleeding.   Current Outpatient Medications on File Prior to Visit  Medication Sig Dispense Refill  . ALPRAZolam (XANAX) 0.5 MG tablet TK 1 TO 2 TS PO BID PRN  5  . valACYclovir (VALTREX) 1000 MG tablet TK 1 T PO QD  4   No current facility-administered medications on file prior to visit.     No Known Allergies  No past medical history on file.  Past Surgical History:  Procedure Laterality Date  . ABDOMINAL HYSTERECTOMY      No family history on file.  Social History   Socioeconomic History  . Marital status: Single    Spouse name: Not on file  . Number of children: Not on file  . Years of education: Not on file  . Highest education level: Not on file  Social Needs  . Financial resource strain: Not on file  . Food insecurity - worry: Not on file  . Food insecurity - inability: Not on file  . Transportation needs - medical: Not on file  . Transportation needs - non-medical: Not on file  Occupational History  . Not on file  Tobacco Use  . Smoking status: Never Smoker  . Smokeless tobacco: Never Used  Substance and Sexual Activity  . Alcohol use: Yes    Comment: occasonal  . Drug use: No  . Sexual activity: Not on file  Other Topics Concern  . Not on file  Social History Narrative  . Not on file   The PMH, PSH, Social History, Family History, Medications, and allergies [facility], and have been updated if relevant.  OBJECTIVE:  BP 126/70 (BP Location: Left Arm, Patient Position: Sitting, Cuff Size: Normal)   Pulse 86   Temp 98.2 F (36.8 C) (Oral)   Ht 5' 4.25" (1.632 m)   Wt 142 lb 12.8 oz (64.8 kg)   PF 97 L/min   BMI 24.32 kg/m   Appears well, in no apparent distress.  Vital signs are normal. The abdomen is soft without tenderness, guarding, mass, rebound or  organomegaly. No CVA tenderness or inguinal adenopathy noted. Urine dipstick shows positive for RBC's.   ASSESSMENT: urinary frequency- admits to drinking more tea lately.  PLAN: Treatment per orders - reassurance, also push fluids, drink water, less tea. Call or return to clinic prn if these symptoms worsen or fail to improve as anticipated. The patient indicates understanding of these issues and agrees with the plan.

## 2017-11-04 NOTE — Patient Instructions (Signed)
Great to see you. Your urine looked good other than some microscopic blood which it appears you've had for years.  Keep drinking water, less tea, and keep me updated.

## 2018-01-15 ENCOUNTER — Ambulatory Visit
Admission: RE | Admit: 2018-01-15 | Discharge: 2018-01-15 | Disposition: A | Discharge status: Home / Self Care | Payer: 59 | Source: Ambulatory Visit | Attending: Emergency Medicine | Admitting: Emergency Medicine

## 2018-01-15 ENCOUNTER — Other Ambulatory Visit: Payer: Self-pay | Admitting: Emergency Medicine

## 2018-01-15 DIAGNOSIS — M7989 Other specified soft tissue disorders: Secondary | ICD-10-CM

## 2018-01-15 DIAGNOSIS — M79671 Pain in right foot: Secondary | ICD-10-CM

## 2018-02-10 DIAGNOSIS — M25571 Pain in right ankle and joints of right foot: Secondary | ICD-10-CM | POA: Diagnosis not present

## 2018-07-14 DIAGNOSIS — Z6828 Body mass index (BMI) 28.0-28.9, adult: Secondary | ICD-10-CM | POA: Diagnosis not present

## 2018-07-14 DIAGNOSIS — Z01419 Encounter for gynecological examination (general) (routine) without abnormal findings: Secondary | ICD-10-CM | POA: Diagnosis not present

## 2018-08-10 ENCOUNTER — Encounter: Payer: Self-pay | Admitting: Family Medicine

## 2018-08-10 ENCOUNTER — Ambulatory Visit (INDEPENDENT_AMBULATORY_CARE_PROVIDER_SITE_OTHER): Payer: 59 | Admitting: Family Medicine

## 2018-08-10 VITALS — BP 138/74 | HR 72 | Temp 98.3°F | Ht 64.25 in | Wt 168.0 lb

## 2018-08-10 DIAGNOSIS — R1031 Right lower quadrant pain: Secondary | ICD-10-CM

## 2018-08-10 DIAGNOSIS — R1013 Epigastric pain: Secondary | ICD-10-CM

## 2018-08-10 DIAGNOSIS — R3129 Other microscopic hematuria: Secondary | ICD-10-CM

## 2018-08-10 DIAGNOSIS — R109 Unspecified abdominal pain: Secondary | ICD-10-CM | POA: Diagnosis not present

## 2018-08-10 DIAGNOSIS — R10A1 Flank pain, right side: Secondary | ICD-10-CM | POA: Insufficient documentation

## 2018-08-10 DIAGNOSIS — Z8619 Personal history of other infectious and parasitic diseases: Secondary | ICD-10-CM

## 2018-08-10 LAB — COMPREHENSIVE METABOLIC PANEL
ALT: 21 U/L (ref 0–35)
AST: 22 U/L (ref 0–37)
Albumin: 4.7 g/dL (ref 3.5–5.2)
Alkaline Phosphatase: 68 U/L (ref 39–117)
BUN: 17 mg/dL (ref 6–23)
CALCIUM: 9.9 mg/dL (ref 8.4–10.5)
CO2: 29 meq/L (ref 19–32)
CREATININE: 0.89 mg/dL (ref 0.40–1.20)
Chloride: 104 mEq/L (ref 96–112)
GFR: 68.74 mL/min (ref >60.00)
Glucose, Bld: 97 mg/dL (ref 70–99)
POTASSIUM: 4.3 meq/L (ref 3.5–5.1)
Sodium: 141 mEq/L (ref 135–145)
Total Bilirubin: 0.5 mg/dL (ref 0.2–1.2)
Total Protein: 7.9 g/dL (ref 6.0–8.3)

## 2018-08-10 LAB — POC URINALSYSI DIPSTICK (AUTOMATED)
BILIRUBIN UA: NEGATIVE
Blood, UA: POSITIVE
Glucose, UA: NEGATIVE
KETONES UA: NEGATIVE
LEUKOCYTES UA: NEGATIVE
Nitrite, UA: NEGATIVE
PH UA: 5.5 (ref 5.0–8.0)
PROTEIN UA: NEGATIVE
Spec Grav, UA: 1.015 (ref 1.010–1.025)
Urobilinogen, UA: 0.2 E.U./dL

## 2018-08-10 LAB — CBC
HEMATOCRIT: 40.4 % (ref 36.0–46.0)
Hemoglobin: 13 g/dL (ref 12.0–15.0)
MCHC: 32.1 g/dL (ref 30.0–36.0)
MCV: 83.8 fl (ref 78.0–100.0)
PLATELETS: 272 10*3/uL (ref 150.0–400.0)
RBC: 4.82 Mil/uL (ref 3.87–5.11)
RDW: 14.3 % (ref 11.5–15.5)
WBC: 5.8 10*3/uL (ref 4.0–10.5)

## 2018-08-10 LAB — LIPASE: Lipase: 6 U/L — ABNORMAL LOW (ref 11.0–59.0)

## 2018-08-10 NOTE — Patient Instructions (Signed)
Great to see you. I will call you with your lab results from today and you can view them online.   Please stop by on your way out to schedule your CT.

## 2018-08-10 NOTE — Progress Notes (Signed)
Subjective:   Patient ID: Ashley Lin, female    DOB: 1956/01/15, 62 y.o.   MRN: [id number]  Ashley Lin is a pleasant 62 y.o. year old female who presents to clinic today with Abdominal Pain (has been intermittent-increasing over the past three days. Denies dysuria. Pain is located right lower quadrant radiates upward to her ribs. Denies changes in her bowels. Denies nausea or vomiting. Denies fevers. Pain does wake her up in her sleep. She has been applying heat and taking Advil. Denies injury or trauma.)  on 08/10/2018  HPI:  Right sided flank pain- started in her back 3 days, radiates to right groin.  Intermittently going on for years.  Usually resolves on it's own with a heating pad after one day.  This time is lasting longer.  Heating pad does help.  Intensity has worsened.  Now hurts when she rolls over in her sleep- wakes her up.  Remote h/o hysterectomy and right ovary.  Had an abdominal CT on 07/29/2012 due to this pain-  IMPRESSION: No renal or ureteral calculi or findings to suggest renal collecting system obstruction.  Slight thickening of the distal esophagus may be related to under distension.  Mucosal abnormality not entirely excluded.  Colonoscopy and endoscopy 05/22/16- diverticulosis and h pylori. She does feel like maybe some of her current symptoms were present when she did have an active H Pylori infection.  No known history of kidney stones but she does have a h/o intermittent microscopic hematuria according to her chart.  Pain is "nerve like."  She did have shingles on that side of her back multiple times.  No rash currently.    She is under more stress lately.  Her mom was just placed in a home, she is quitting her job in 11/2017. Current Outpatient Medications on File Prior to Visit  Medication Sig Dispense Refill  . valACYclovir (VALTREX) 1000 MG tablet TK 1 T PO QD  4   No current facility-administered medications on file prior to visit.       No Known Allergies  No past medical history on file.  Past Surgical History:  Procedure Laterality Date  . ABDOMINAL HYSTERECTOMY      No family history on file.  Social History   Socioeconomic History  . Marital status: Single    Spouse name: Not on file  . Number of children: Not on file  . Years of education: Not on file  . Highest education level: Not on file  Occupational History  . Not on file  Social Needs  . Financial resource strain: Not on file  . Food insecurity:    Worry: Not on file    Inability: Not on file  . Transportation needs:    Medical: Not on file    Non-medical: Not on file  Tobacco Use  . Smoking status: Never Smoker  . Smokeless tobacco: Never Used  Substance and Sexual Activity  . Alcohol use: Yes    Comment: occasonal  . Drug use: No  . Sexual activity: Not on file  Lifestyle  . Physical activity:    Days per week: Not on file    Minutes per session: Not on file  . Stress: Not on file  Relationships  . Social connections:    Talks on phone: Not on file    Gets together: Not on file    Attends religious service: Not on file    Active member of club or organization: Not on file  Attends meetings of clubs or organizations: Not on file    Relationship status: Not on file  . Intimate partner violence:    Fear of current or ex partner: Not on file    Emotionally abused: Not on file    Physically abused: Not on file    Forced sexual activity: Not on file  Other Topics Concern  . Not on file  Social History Narrative  . Not on file   The PMH, PSH, Social History, Family History, Medications, and allergies have been reviewed in [facility], and have been updated if relevant.   Review of Systems  Constitutional: Positive for fatigue.  HENT: Negative.   Respiratory: Negative.   Cardiovascular: Negative.   Gastrointestinal: Positive for abdominal pain.  Genitourinary: Negative.   Musculoskeletal: Positive for back pain.  Skin:  Negative.   Neurological: Negative.   Hematological: Negative.   Psychiatric/Behavioral: Negative.   All other systems reviewed and are negative.      Objective:    BP 138/74   Pulse 72   Temp 98.3 F (36.8 C) (Oral)   Ht 5' 4.25" (1.632 m)   Wt 168 lb (76.2 kg)   SpO2 98%   BMI 28.61 kg/m    Physical Exam  Constitutional: She is oriented to person, place, and time. She appears well-developed and well-nourished.  Non-toxic appearance. She does not appear ill.  HENT:  Head: Normocephalic and atraumatic.  Eyes: EOM are normal.  Cardiovascular: Normal rate.  Pulmonary/Chest: Effort normal.  Abdominal: Soft. Normal appearance and bowel sounds are normal. There is no hepatosplenomegaly. There is no tenderness. There is no rigidity, no rebound, no guarding and negative Murphy's sign.  Neurological: She is alert and oriented to person, place, and time.  Skin: Skin is warm and dry.  Psychiatric: She has a normal mood and affect. Her behavior is normal.  Nursing note and vitals reviewed.         Assessment & Plan:   Right lower quadrant abdominal pain - Plan: POCT Urinalysis Dipstick (Automated), CT RENAL STONE STUDY  Microscopic hematuria, persistant  Right flank pain  Abdominal pain, epigastric - Plan: Lipase, Comprehensive metabolic panel, CBC, Helicobacter pylori special antigen, CANCELED: H. pylori antibody, IgG  History of Helicobacter pylori infection - Plan: Helicobacter pylori special antigen, CANCELED: Helicobacter pylori special antigen No follow-ups on file.

## 2018-08-10 NOTE — Assessment & Plan Note (Signed)
Intermittent for over 6 years but she feels it has been getting worse in recent months.-pt recently established care with me. >40 minutes spent in face to face time with patient, >50% spent in counselling or coordination of care.  CT scan from 2013 neg.  Since that time, symptoms have progressed.  She does have diverticulosis, symptoms could possibly due to diverticulitis although not classic.  She again has microscopic hematuria today and symptoms are consistent with nephrolithiasis.  ? Also shingles re infection.  Symptoms do warrant another CT of her abdomen and pelvis. Labs today, CT of abdomen and pelvis.  Orders Placed This Encounter  Procedures  . Helicobacter pylori special antigen  . CT RENAL STONE STUDY  . Lipase  . Comprehensive metabolic panel  . CBC  . POCT Urinalysis Dipstick (Automated)

## 2018-08-11 ENCOUNTER — Telehealth: Payer: Self-pay

## 2018-08-11 NOTE — Telephone Encounter (Signed)
Copied from CRM [phone]. Topic: General - Call Back - No Documentation >> Aug 10, 2018  4:52 PM Lynne LoganHudson, Caryn D wrote: Reason for CRM: Pt had an OV today and was supposed to be contacted today for a CT scan by [facility] but did not hear anything. She is now unable to attend today. Please advise CB#[phone] >> Aug 11, 2018  8:04 AM Tanja Portockman, Caitlin P wrote: Not our patient

## 2018-08-11 NOTE — Telephone Encounter (Signed)
Spoke with patient-informed of appt at [facility] for 08/12/18 at 2pm. Verbalized understanding.

## 2018-08-12 ENCOUNTER — Ambulatory Visit
Admission: RE | Admit: 2018-08-12 | Discharge: 2018-08-12 | Disposition: A | Discharge status: Home / Self Care | Payer: 59 | Source: Ambulatory Visit | Attending: Family Medicine | Admitting: Family Medicine

## 2018-08-12 DIAGNOSIS — R3129 Other microscopic hematuria: Secondary | ICD-10-CM | POA: Diagnosis not present

## 2018-08-12 DIAGNOSIS — R1031 Right lower quadrant pain: Secondary | ICD-10-CM

## 2018-08-13 ENCOUNTER — Other Ambulatory Visit: Payer: Self-pay | Admitting: Family Medicine

## 2018-08-13 DIAGNOSIS — K8020 Calculus of gallbladder without cholecystitis without obstruction: Secondary | ICD-10-CM

## 2018-08-13 DIAGNOSIS — R1011 Right upper quadrant pain: Secondary | ICD-10-CM

## 2018-08-17 ENCOUNTER — Other Ambulatory Visit: Payer: 59

## 2018-08-17 DIAGNOSIS — Z8619 Personal history of other infectious and parasitic diseases: Secondary | ICD-10-CM

## 2018-08-17 DIAGNOSIS — R1013 Epigastric pain: Secondary | ICD-10-CM | POA: Diagnosis not present

## 2018-08-18 LAB — HELICOBACTER PYLORI  SPECIAL ANTIGEN
MICRO NUMBER:: 91386094
SPECIMEN QUALITY: ADEQUATE

## 2018-08-19 DIAGNOSIS — K8020 Calculus of gallbladder without cholecystitis without obstruction: Secondary | ICD-10-CM | POA: Diagnosis not present

## 2018-08-24 ENCOUNTER — Ambulatory Visit: Payer: Self-pay | Admitting: General Surgery

## 2018-09-01 NOTE — Pre-Procedure Instructions (Signed)
Ashley Lin  09/01/2018      [id number] [place], [place] - [address] AT [facility] OF [facility] & [address] [place] [address] Phone: [phone] Fax: [phone]    Your procedure is scheduled on 09/07/18.  Report to [facility] Admitting at 730 A.M.  Call this number if you have problems the morning of surgery:  [phone]   Remember:  Do not eat or drink after midnight.      Take these medicines the morning of surgery with A SIP OF WATER ---none    Do not wear jewelry, make-up or nail polish.  Do not wear lotions, powders, or perfumes, or deodorant.  Do not shave 48 hours prior to surgery.  Men may shave face and neck.  Do not bring valuables to the hospital.  [facility] is not responsible for any belongings or valuables.  Contacts, dentures or bridgework may not be worn into surgery.  Leave your suitcase in the car.  After surgery it may be brought to your room.  For patients admitted to the hospital, discharge time will be determined by your treatment team.  Patients discharged the day of surgery will not be allowed to drive home.   Name and phone number of your driver:   Do not take any aspirin,anti-inflammatories,vitamins,or herbal supplements 5-7 days prior to surgery. Special instructions:  [place] - Preparing for Surgery  Before surgery, you can play an important role.  Because skin is not sterile, your skin needs to be as free of germs as possible.  You can reduce the number of germs on you skin by washing with CHG (chlorahexidine gluconate) soap before surgery.  CHG is an antiseptic cleaner which kills germs and bonds with the skin to continue killing germs even after washing.  Oral Hygiene is also important in reducing the risk of infection.  Remember to brush your teeth with your regular toothpaste the morning of surgery.  Please DO NOT use if you have an allergy to CHG or antibacterial  soaps.  If your skin becomes reddened/irritated stop using the CHG and inform your nurse when you arrive at Short Stay.  Do not shave (including legs and underarms) for at least 48 hours prior to the first CHG shower.  You may shave your face.  Please follow these instructions carefully:   1.  Shower with CHG Soap the night before surgery and the morning of Surgery.  2.  If you choose to wash your hair, wash your hair first as usual with your normal shampoo.  3.  After you shampoo, rinse your hair and body thoroughly to remove the shampoo. 4.  Use CHG as you would any other liquid soap.  You can apply chg directly to the skin and wash gently with a      scrungie or washcloth.           5.  Apply the CHG Soap to your body ONLY FROM THE NECK DOWN.   Do not use on open wounds or open sores. Avoid contact with your eyes, ears, mouth and genitals (private parts).  Wash genitals (private parts) with your normal soap.  6.  Wash thoroughly, paying special attention to the area where your surgery will be performed.  7.  Thoroughly rinse your body with warm water from the neck down.  8.  DO NOT shower/wash with your normal soap after using and rinsing  off the CHG Soap.  9.  Pat yourself dry with a clean towel.            10.  Wear clean pajamas.            11.  Place clean sheets on your bed the night of your first shower and do not sleep with pets.  Day of Surgery  Do not apply any lotions/deoderants the morning of surgery.   Please wear clean clothes to the hospital/surgery center. Remember to brush your teeth with toothpaste.    Please read over the following fact sheets that you were given.

## 2018-09-02 ENCOUNTER — Encounter (HOSPITAL_COMMUNITY): Payer: Self-pay

## 2018-09-02 ENCOUNTER — Encounter (HOSPITAL_COMMUNITY)
Admission: RE | Admit: 2018-09-02 | Discharge: 2018-09-02 | Disposition: A | Discharge status: Home / Self Care | Payer: 59 | Source: Ambulatory Visit | Attending: General Surgery | Admitting: General Surgery

## 2018-09-02 DIAGNOSIS — Z01812 Encounter for preprocedural laboratory examination: Secondary | ICD-10-CM | POA: Diagnosis present

## 2018-09-02 HISTORY — DX: Unspecified abdominal pain: R10.9

## 2018-09-02 HISTORY — DX: Endometriosis, unspecified: N80.9

## 2018-09-02 LAB — CBC
HCT: 41.5 % (ref 36.0–46.0)
HEMOGLOBIN: 12.7 g/dL (ref 12.0–15.0)
MCH: 26.3 pg (ref 26.0–34.0)
MCHC: 30.6 g/dL (ref 30.0–36.0)
MCV: 85.9 fL (ref 80.0–100.0)
Platelets: 294 10*3/uL (ref 150–400)
RBC: 4.83 MIL/uL (ref 3.87–5.11)
RDW: 14.2 % (ref 11.5–15.5)
WBC: 8.4 10*3/uL (ref 4.0–10.5)
nRBC: 0 % (ref 0.0–0.2)

## 2018-09-02 MED ORDER — CHLORHEXIDINE GLUCONATE CLOTH 2 % EX PADS: 6.0000 | MEDICATED_PAD | Freq: Once | CUTANEOUS | Status: DC

## 2018-09-07 ENCOUNTER — Encounter (HOSPITAL_COMMUNITY): Payer: Self-pay | Admitting: *Deleted

## 2018-09-07 ENCOUNTER — Other Ambulatory Visit: Payer: Self-pay

## 2018-09-07 ENCOUNTER — Encounter (HOSPITAL_COMMUNITY)
Admission: RE | Disposition: A | Discharge status: Home / Self Care | Payer: Self-pay | Source: Ambulatory Visit | Attending: General Surgery

## 2018-09-07 ENCOUNTER — Ambulatory Visit (HOSPITAL_COMMUNITY): Payer: 59 | Admitting: Anesthesiology

## 2018-09-07 ENCOUNTER — Ambulatory Visit (HOSPITAL_COMMUNITY)
Admission: RE | Admit: 2018-09-07 | Discharge: 2018-09-07 | Disposition: A | Discharge status: Home / Self Care | Payer: 59 | Source: Ambulatory Visit | Attending: General Surgery | Admitting: General Surgery

## 2018-09-07 DIAGNOSIS — K219 Gastro-esophageal reflux disease without esophagitis: Secondary | ICD-10-CM | POA: Insufficient documentation

## 2018-09-07 DIAGNOSIS — K8010 Calculus of gallbladder with chronic cholecystitis without obstruction: Secondary | ICD-10-CM | POA: Diagnosis not present

## 2018-09-07 DIAGNOSIS — Z791 Long term (current) use of non-steroidal anti-inflammatories (NSAID): Secondary | ICD-10-CM | POA: Diagnosis not present

## 2018-09-07 DIAGNOSIS — Z79899 Other long term (current) drug therapy: Secondary | ICD-10-CM | POA: Insufficient documentation

## 2018-09-07 DIAGNOSIS — K811 Chronic cholecystitis: Secondary | ICD-10-CM | POA: Diagnosis not present

## 2018-09-07 HISTORY — PX: CHOLECYSTECTOMY: SHX55

## 2018-09-07 SURGERY — LAPAROSCOPIC CHOLECYSTECTOMY WITH INTRAOPERATIVE CHOLANGIOGRAM
Anesthesia: General | Site: Abdomen

## 2018-09-07 MED ORDER — ACETAMINOPHEN 325 MG PO TABS: 325.0000 mg | ORAL_TABLET | ORAL | Status: DC | PRN

## 2018-09-07 MED ORDER — ONDANSETRON HCL 4 MG/2ML IJ SOLN
INTRAMUSCULAR | Status: DC | PRN
  Administered 2018-09-07: 4 mg via INTRAVENOUS

## 2018-09-07 MED ORDER — ESMOLOL HCL 100 MG/10ML IV SOLN
INTRAVENOUS | Status: DC | PRN
  Administered 2018-09-07: 30 mg via INTRAVENOUS
  Administered 2018-09-07: 20 mg via INTRAVENOUS

## 2018-09-07 MED ORDER — 0.9 % SODIUM CHLORIDE (POUR BTL) OPTIME
TOPICAL | Status: DC | PRN
  Administered 2018-09-07: 1000 mL

## 2018-09-07 MED ORDER — MIDAZOLAM HCL 2 MG/2ML IJ SOLN
INTRAMUSCULAR | Status: AC
  Filled 2018-09-07: qty 2

## 2018-09-07 MED ORDER — PHENYLEPHRINE 40 MCG/ML (10ML) SYRINGE FOR IV PUSH (FOR BLOOD PRESSURE SUPPORT)
PREFILLED_SYRINGE | INTRAVENOUS | Status: DC | PRN
  Administered 2018-09-07: 120 ug via INTRAVENOUS

## 2018-09-07 MED ORDER — ACETAMINOPHEN 160 MG/5ML PO SOLN: 325.0000 mg | ORAL | Status: DC | PRN

## 2018-09-07 MED ORDER — FENTANYL CITRATE (PF) 100 MCG/2ML IJ SOLN
25.0000 ug | INTRAMUSCULAR | Status: DC | PRN
  Administered 2018-09-07 (×2): 50 ug via INTRAVENOUS

## 2018-09-07 MED ORDER — CEFAZOLIN SODIUM-DEXTROSE 2-4 GM/100ML-% IV SOLN
INTRAVENOUS | Status: AC
  Filled 2018-09-07: qty 100

## 2018-09-07 MED ORDER — FENTANYL CITRATE (PF) 250 MCG/5ML IJ SOLN
INTRAMUSCULAR | Status: AC
  Filled 2018-09-07: qty 5

## 2018-09-07 MED ORDER — OXYCODONE HCL 5 MG PO TABS
5.0000 mg | ORAL_TABLET | Freq: Once | ORAL | Status: AC | PRN
  Administered 2018-09-07: 5 mg via ORAL

## 2018-09-07 MED ORDER — OXYCODONE HCL 5 MG PO TABS
ORAL_TABLET | ORAL | Status: AC
  Filled 2018-09-07: qty 1

## 2018-09-07 MED ORDER — ACETAMINOPHEN 500 MG PO TABS
1000.0000 mg | ORAL_TABLET | ORAL | Status: AC
  Administered 2018-09-07: 1000 mg via ORAL

## 2018-09-07 MED ORDER — SUGAMMADEX SODIUM 200 MG/2ML IV SOLN
INTRAVENOUS | Status: DC | PRN
  Administered 2018-09-07: 200 mg via INTRAVENOUS

## 2018-09-07 MED ORDER — DEXAMETHASONE SODIUM PHOSPHATE 10 MG/ML IJ SOLN
INTRAMUSCULAR | Status: AC
  Filled 2018-09-07: qty 1

## 2018-09-07 MED ORDER — FENTANYL CITRATE (PF) 100 MCG/2ML IJ SOLN
INTRAMUSCULAR | Status: DC | PRN
  Administered 2018-09-07: 50 ug via INTRAVENOUS

## 2018-09-07 MED ORDER — ROCURONIUM BROMIDE 10 MG/ML (PF) SYRINGE
PREFILLED_SYRINGE | INTRAVENOUS | Status: DC | PRN
  Administered 2018-09-07: 50 mg via INTRAVENOUS

## 2018-09-07 MED ORDER — HYDROCODONE-ACETAMINOPHEN 10-325 MG PO TABS: 1.0000 | ORAL_TABLET | Freq: Four times a day (QID) | ORAL | 0 refills | Status: AC | PRN

## 2018-09-07 MED ORDER — PROPOFOL 10 MG/ML IV BOLUS
INTRAVENOUS | Status: DC | PRN
  Administered 2018-09-07: 160 mg via INTRAVENOUS

## 2018-09-07 MED ORDER — LIDOCAINE 2% (20 MG/ML) 5 ML SYRINGE
INTRAMUSCULAR | Status: DC | PRN
  Administered 2018-09-07: 100 mg via INTRAVENOUS

## 2018-09-07 MED ORDER — GABAPENTIN 300 MG PO CAPS
300.0000 mg | ORAL_CAPSULE | ORAL | Status: AC
  Administered 2018-09-07: 300 mg via ORAL

## 2018-09-07 MED ORDER — ROCURONIUM BROMIDE 50 MG/5ML IV SOSY
PREFILLED_SYRINGE | INTRAVENOUS | Status: AC
  Filled 2018-09-07: qty 5

## 2018-09-07 MED ORDER — DEXAMETHASONE SODIUM PHOSPHATE 10 MG/ML IJ SOLN
INTRAMUSCULAR | Status: DC | PRN
  Administered 2018-09-07: 10 mg via INTRAVENOUS

## 2018-09-07 MED ORDER — PROPOFOL 10 MG/ML IV BOLUS
INTRAVENOUS | Status: AC
  Filled 2018-09-07: qty 20

## 2018-09-07 MED ORDER — ROCURONIUM BROMIDE 50 MG/5ML IV SOSY
PREFILLED_SYRINGE | INTRAVENOUS | Status: AC
  Filled 2018-09-07: qty 10

## 2018-09-07 MED ORDER — FENTANYL CITRATE (PF) 100 MCG/2ML IJ SOLN
INTRAMUSCULAR | Status: AC
  Filled 2018-09-07: qty 2

## 2018-09-07 MED ORDER — ONDANSETRON HCL 4 MG/2ML IJ SOLN
INTRAMUSCULAR | Status: AC
  Filled 2018-09-07: qty 2

## 2018-09-07 MED ORDER — LACTATED RINGERS IV SOLN
INTRAVENOUS | Status: DC | PRN
  Administered 2018-09-07: 10:00:00 via INTRAVENOUS

## 2018-09-07 MED ORDER — DEXMEDETOMIDINE HCL IN NACL 200 MCG/50ML IV SOLN
INTRAVENOUS | Status: AC
  Filled 2018-09-07: qty 50

## 2018-09-07 MED ORDER — SODIUM CHLORIDE 0.9 % IR SOLN
Status: DC | PRN
  Administered 2018-09-07: 1000 mL

## 2018-09-07 MED ORDER — CEFAZOLIN SODIUM-DEXTROSE 2-4 GM/100ML-% IV SOLN
2.0000 g | INTRAVENOUS | Status: AC
  Administered 2018-09-07: 2 g via INTRAVENOUS

## 2018-09-07 MED ORDER — LACTATED RINGERS IV SOLN
INTRAVENOUS | Status: DC
  Administered 2018-09-07: 09:00:00 via INTRAVENOUS

## 2018-09-07 MED ORDER — OXYCODONE HCL 5 MG/5ML PO SOLN: 5.0000 mg | Freq: Once | ORAL | Status: AC | PRN

## 2018-09-07 MED ORDER — OXYCODONE HCL 5 MG PO TABS: 5.0000 mg | ORAL_TABLET | Freq: Four times a day (QID) | ORAL | 0 refills | Status: DC | PRN

## 2018-09-07 MED ORDER — ONDANSETRON HCL 4 MG/2ML IJ SOLN
4.0000 mg | Freq: Once | INTRAMUSCULAR | Status: AC | PRN
  Administered 2018-09-07: 4 mg via INTRAVENOUS

## 2018-09-07 MED ORDER — GABAPENTIN 300 MG PO CAPS
ORAL_CAPSULE | ORAL | Status: AC
  Administered 2018-09-07: 300 mg via ORAL
  Filled 2018-09-07: qty 1

## 2018-09-07 MED ORDER — BUPIVACAINE-EPINEPHRINE (PF) 0.25% -1:200000 IJ SOLN
INTRAMUSCULAR | Status: AC
  Filled 2018-09-07: qty 30

## 2018-09-07 MED ORDER — ACETAMINOPHEN 500 MG PO TABS
ORAL_TABLET | ORAL | Status: AC
  Administered 2018-09-07: 1000 mg via ORAL
  Filled 2018-09-07: qty 2

## 2018-09-07 MED ORDER — IOPAMIDOL (ISOVUE-300) INJECTION 61%
INTRAVENOUS | Status: AC
  Filled 2018-09-07: qty 50

## 2018-09-07 MED ORDER — MIDAZOLAM HCL 5 MG/5ML IJ SOLN
INTRAMUSCULAR | Status: DC | PRN
  Administered 2018-09-07: 2 mg via INTRAVENOUS

## 2018-09-07 MED ORDER — MEPERIDINE HCL 50 MG/ML IJ SOLN: 6.2500 mg | INTRAMUSCULAR | Status: DC | PRN

## 2018-09-07 MED ORDER — LIDOCAINE 2% (20 MG/ML) 5 ML SYRINGE
INTRAMUSCULAR | Status: AC
  Filled 2018-09-07: qty 5

## 2018-09-07 MED ORDER — BUPIVACAINE-EPINEPHRINE 0.25% -1:200000 IJ SOLN
INTRAMUSCULAR | Status: DC | PRN
  Administered 2018-09-07: 21 mL

## 2018-09-07 SURGICAL SUPPLY — 48 items
ADH SKN CLS APL DERMABOND .7 (GAUZE/BANDAGES/DRESSINGS) ×1
APPLIER CLIP 5 13 M/L LIGAMAX5 (MISCELLANEOUS) ×3
APR CLP MED LRG 5 ANG JAW (MISCELLANEOUS) ×1
BAG SPEC RTRVL 10 TROC 200 (ENDOMECHANICALS) ×1
CANISTER SUCT 3000ML PPV (MISCELLANEOUS) ×3 IMPLANT
CHLORAPREP W/TINT 26ML (MISCELLANEOUS) ×3 IMPLANT
CLIP APPLIE 5 13 M/L LIGAMAX5 (MISCELLANEOUS) ×1 IMPLANT
COVER MAYO STAND STRL (DRAPES) ×3 IMPLANT
COVER SURGICAL LIGHT HANDLE (MISCELLANEOUS) ×3 IMPLANT
COVER WAND RF STERILE (DRAPES) ×1 IMPLANT
DERMABOND ADVANCED (GAUZE/BANDAGES/DRESSINGS) ×2
DERMABOND ADVANCED .7 DNX12 (GAUZE/BANDAGES/DRESSINGS) ×1 IMPLANT
DRAPE C-ARM 42X72 X-RAY (DRAPES) ×1 IMPLANT
ELECT REM PT RETURN 9FT ADLT (ELECTROSURGICAL) ×3
ELECTRODE REM PT RTRN 9FT ADLT (ELECTROSURGICAL) ×1 IMPLANT
FILTER SMOKE EVAC LAPAROSHD (FILTER) ×2 IMPLANT
GLOVE BIO SURGEON STRL SZ7 (GLOVE) ×6 IMPLANT
GLOVE BIO SURGEON STRL SZ8 (GLOVE) ×3 IMPLANT
GLOVE BIOGEL PI IND STRL 7.0 (GLOVE) IMPLANT
GLOVE BIOGEL PI IND STRL 8 (GLOVE) ×1 IMPLANT
GLOVE BIOGEL PI INDICATOR 7.0 (GLOVE) ×4
GLOVE BIOGEL PI INDICATOR 8 (GLOVE) ×2
GOWN STRL REUS W/ TWL LRG LVL3 (GOWN DISPOSABLE) ×2 IMPLANT
GOWN STRL REUS W/ TWL XL LVL3 (GOWN DISPOSABLE) ×1 IMPLANT
GOWN STRL REUS W/TWL LRG LVL3 (GOWN DISPOSABLE) ×6
GOWN STRL REUS W/TWL XL LVL3 (GOWN DISPOSABLE) ×3
KIT BASIN OR (CUSTOM PROCEDURE TRAY) ×3 IMPLANT
KIT TURNOVER KIT B (KITS) ×3 IMPLANT
L-HOOK LAP DISP 36CM (ELECTROSURGICAL) ×3
LHOOK LAP DISP 36CM (ELECTROSURGICAL) ×1 IMPLANT
NEEDLE 22X1 1/2 (OR ONLY) (NEEDLE) ×3 IMPLANT
NS IRRIG 1000ML POUR BTL (IV SOLUTION) ×3 IMPLANT
PAD ARMBOARD 7.5X6 YLW CONV (MISCELLANEOUS) ×7 IMPLANT
PENCIL BUTTON HOLSTER BLD 10FT (ELECTRODE) ×3 IMPLANT
POUCH RETRIEVAL ECOSAC 10 (ENDOMECHANICALS) ×1 IMPLANT
POUCH RETRIEVAL ECOSAC 10MM (ENDOMECHANICALS) ×2
SCISSORS LAP 5X35 DISP (ENDOMECHANICALS) ×3 IMPLANT
SET CHOLANGIOGRAPH 5 50 .035 (SET/KITS/TRAYS/PACK) ×1 IMPLANT
SET IRRIG TUBING LAPAROSCOPIC (IRRIGATION / IRRIGATOR) ×3 IMPLANT
SLEEVE ENDOPATH XCEL 5M (ENDOMECHANICALS) ×6 IMPLANT
SPECIMEN JAR SMALL (MISCELLANEOUS) ×3 IMPLANT
SUT VIC AB 4-0 PS2 27 (SUTURE) ×3 IMPLANT
TOWEL GREEN STERILE FF (TOWEL DISPOSABLE) ×2 IMPLANT
TRAY LAPAROSCOPIC MC (CUSTOM PROCEDURE TRAY) ×3 IMPLANT
TROCAR XCEL BLUNT TIP 100MML (ENDOMECHANICALS) ×3 IMPLANT
TROCAR XCEL NON-BLD 5MMX100MML (ENDOMECHANICALS) ×3 IMPLANT
TUBING INSUFFLATION (TUBING) ×3 IMPLANT
WATER STERILE IRR 1000ML POUR (IV SOLUTION) ×3 IMPLANT

## 2018-09-07 NOTE — Op Note (Signed)
09/07/2018  10:48 AM  PATIENT:  Ashley Lin  62 y.o. female  PRE-OPERATIVE DIAGNOSIS:  Symptomatic cholelithiasis  POST-OPERATIVE DIAGNOSIS: Chronic cholecystitis  PROCEDURE:  Procedure(s): LAPAROSCOPIC CHOLECYSTECTOMY  SURGEON:  Surgeon(s): [name], [name], MD  ASSISTANTS: none   ANESTHESIA:   local and general  EBL:  Total I/O In: -  Out: 50 [Blood:50]  BLOOD ADMINISTERED:none  DRAINS: none   SPECIMEN:  Excision  DISPOSITION OF SPECIMEN:  PATHOLOGY  COUNTS:  YES  DICTATION: .Dragon Dictation [name] presents for laparoscopic cholecystectomy.  She was evaluated in the preop holding area.  Informed consent was obtained.  She received intravenous antibiotics.  She was brought to the operating room and general endotracheal anesthesia was administered by the anesthesia staff.  Her abdomen was prepped and draped in a sterile fashion.  Timeout procedure was performed.The infraumbilical region was infiltrated with local. Infraumbilical incision was made. Subcutaneous tissues were dissected down revealing the anterior fascia. This was divided sharply along the midline. Peritoneal cavity was entered under direct vision without complication. A 0 Vicryl pursestring was placed around the fascial opening. Hassan trocar was inserted into the abdomen. The abdomen was insufflated with carbon dioxide in standard fashion. Under direct vision a 5 mm epigastric and 5 mm right abdominal port x2 were placed.  Local was used at each port site.  Laparoscopic exploration revealed evidence of chronic inflammation of her gallbladder with filmy omental adhesions to the body.  These were taken down gently with cautery exposing the whole body of the gallbladder.  The dome was retracted superior medially.  The infundibulum was retracted inferior and laterally.  Dissection began laterally and progressed medially easily identifying the cystic duct.  It was very small in caliber.  Dissection continued until we  had a critical view of safety.  Once this was accomplished, 3 clips were placed proximally in the cystic duct, one was placed distally and it was divided.  An anterior branch of the cystic artery was then clipped twice proximally and divided distally with cautery.  The gallbladder was taken off the liver bed.  We did encounter a posterior branch of her cystic artery which was clipped twice proximally and distally cauterized.  The gallbladder was taken off the liver bed the rest of the way using cautery and achieving good hemostasis.  The gallbladder was placed in a bag and removed from the abdomen.  It was sent to pathology.  The liver bed was then cauterized achieving excellent hemostasis.  Clips remain in good position.  Liver bed was dry.  Area was copiously irrigated and the irrigation was evacuated.  Ports were removed under direct vision.  Pneumoperitoneum was released.  Infraumbilical fascia was closed by tying the pursestring.  All 4 wounds were irrigated and then closed with 4-0 Vicryl followed by Dermabond.  All counts were correct.  She tolerated the procedure well without apparent complication and was taken recovery in stable condition.  PATIENT DISPOSITION:  PACU - hemodynamically stable.   Delay start of Pharmacological VTE agent (>24hrs) due to surgical blood loss or risk of bleeding:  no  Violeta GelinasBurke Joya Willmott, MD, MPH, FACS Pager: [phone]  12/9/201910:48 AM

## 2018-09-07 NOTE — Interval H&P Note (Signed)
History and Physical Interval Note:  09/07/2018 9:10 AM  Ashley Lin  has presented today for surgery, with the diagnosis of Symptomatic cholelithiasis  The various methods of treatment have been discussed with the patient and family. After consideration of risks, benefits and other options for treatment, the patient has consented to  Procedure(s): LAPAROSCOPIC CHOLECYSTECTOMY WITH INTRAOPERATIVE CHOLANGIOGRAM (N/A) as a surgical intervention .  The patient's history has been reviewed, patient examined, no change in status, stable for surgery.  I have reviewed the patient's chart and labs.  Questions were answered to the patient's satisfaction.     Liz MaladyBurke E Deveion Denz

## 2018-09-07 NOTE — H&P (Signed)
Ashley AuerbachDeborah C Lin is an 62 y.o. female.   Chief Complaint: RUQ pain HPI: Symptomatic cholelithiasis, presents for laparoscopic cholecystectomy.  Past Medical History:  Diagnosis Date  . Abdominal discomfort   . Endometriosis    hx    Past Surgical History:  Procedure Laterality Date  . ABDOMINAL HYSTERECTOMY    . TONSILLECTOMY      History reviewed. No pertinent family history. Social History:  reports that she has never smoked. She has never used smokeless tobacco. She reports that she drinks alcohol. She reports that she does not use drugs.  Allergies: No Known Allergies  Medications Prior to Admission  Medication Sig Dispense Refill  . diphenhydrAMINE (BENADRYL) 25 MG tablet Take 25 mg by mouth every 6 (six) hours as needed for allergies.    . Ibuprofen 200 MG CAPS Take 400 mg by mouth every 6 (six) hours as needed for moderate pain.    . valACYclovir (VALTREX) 1000 MG tablet Take 1,000 mg by mouth daily as needed (cold sores and shingles).   4    No results found for this or any previous visit (from the past 48 hour(s)). No results found.  Review of Systems  Unable to perform ROS: Other    Blood pressure 140/77, pulse 65, temperature 98.5 F (36.9 C), temperature source Oral, resp. rate 18, height 5\' 5"  (1.651 m), weight 76.7 kg, SpO2 97 %. Physical Exam  Constitutional: She is oriented to person, place, and time. She appears well-developed and well-nourished.  HENT:  Head: Normocephalic.  Mouth/Throat: Oropharynx is clear and moist.  Eyes: Pupils are equal, round, and reactive to light.  Neck: Neck supple.  Cardiovascular: Normal rate and regular rhythm.  Respiratory: Effort normal and breath sounds normal. No respiratory distress. She has no wheezes.  GI: Soft. She exhibits no distension. There is no tenderness.  Musculoskeletal: Normal range of motion.  Neurological: She is alert and oriented to person, place, and time.  Psychiatric: She has a normal mood and  affect.     Assessment/Plan Symptomatic cholelithiasis - plan laparoscopic cholecystectomy.  I discussed the procedure, risks, benefits, and expected post-op course with her. She is agreeable.  Ashley MaladyBurke E Omara Alcon, MD 09/07/2018, 9:06 AM

## 2018-09-07 NOTE — Anesthesia Postprocedure Evaluation (Signed)
Anesthesia Post Note  Patient: Ashley AuerbachDeborah C Lin  Procedure(s) Performed: LAPAROSCOPIC CHOLECYSTECTOMY (N/A Abdomen)     Patient location during evaluation: PACU Anesthesia Type: General Level of consciousness: awake and alert Pain management: pain level controlled Vital Signs Assessment: post-procedure vital signs reviewed and stable Respiratory status: spontaneous breathing, nonlabored ventilation, respiratory function stable and patient connected to nasal cannula oxygen Cardiovascular status: blood pressure returned to baseline and stable Postop Assessment: no apparent nausea or vomiting Anesthetic complications: no    Last Vitals:  Vitals:   09/07/18 1137 09/07/18 1153  BP: 116/71 (!) 130/48  Pulse: 63 63  Resp: 14 14  Temp: (!) 36.2 C   SpO2: 99% 98%    Last Pain:  Vitals:   09/07/18 1138  TempSrc:   PainSc: 5                  Angelicia Lessner

## 2018-09-07 NOTE — Anesthesia Procedure Notes (Signed)
Procedure Name: Intubation Performed by: Ezekiel InaBotts, Amyiah Gaba H, CRNA Pre-anesthesia Checklist: Patient identified, Emergency Drugs available, Suction available and Patient being monitored Patient Re-evaluated:Patient Re-evaluated prior to induction Oxygen Delivery Method: Circle System Utilized Preoxygenation: Pre-oxygenation with 100% oxygen Induction Type: IV induction Ventilation: Mask ventilation without difficulty Laryngoscope Size: Miller and 2 Grade View: Grade III Tube type: Oral Tube size: 7.0 mm Number of attempts: 1 Airway Equipment and Method: Stylet and Oral airway Placement Confirmation: ETT inserted through vocal cords under direct vision,  positive ETCO2 and breath sounds checked- equal and bilateral Secured at: 21 cm Tube secured with: Tape Dental Injury: Teeth and Oropharynx as per pre-operative assessment  Future Recommendations: Recommend- induction with short-acting agent, and alternative techniques readily available Comments: Easy Mask- Poor neck extension with overbite. G3 with Mil2. Recommend glidescope

## 2018-09-07 NOTE — Anesthesia Preprocedure Evaluation (Signed)
Anesthesia Evaluation  Patient identified by MRN, date of birth, ID band Patient awake    Reviewed: Allergy & Precautions, H&P , NPO status , Patient's Chart, lab work & pertinent test results, reviewed documented beta blocker date and time   Airway Mallampati: II  TM Distance: >3 FB Neck ROM: full    Dental no notable dental hx.    Pulmonary neg pulmonary ROS,    Pulmonary exam normal breath sounds clear to auscultation       Cardiovascular Exercise Tolerance: Good negative cardio ROS   Rhythm:regular Rate:Normal     Neuro/Psych negative neurological ROS  negative psych ROS   GI/Hepatic Neg liver ROS, GERD  ,  Endo/Other  negative endocrine ROS  Renal/GU negative Renal ROS  negative genitourinary   Musculoskeletal   Abdominal   Peds  Hematology negative hematology ROS (+)   Anesthesia Other Findings   Reproductive/Obstetrics negative OB ROS                             Anesthesia Physical Anesthesia Plan  ASA: II  Anesthesia Plan: General   Post-op Pain Management:    Induction: Intravenous  PONV Risk Score and Plan: 3 and Treatment may vary due to age or medical condition, Ondansetron, Dexamethasone and Midazolam  Airway Management Planned: Oral ETT  Additional Equipment:   Intra-op Plan:   Post-operative Plan: Extubation in OR  Informed Consent: I have reviewed the patients History and Physical, chart, labs and discussed the procedure including the risks, benefits and alternatives for the proposed anesthesia with the patient or authorized representative who has indicated his/her understanding and acceptance.   Dental Advisory Given  Plan Discussed with: CRNA, Anesthesiologist and Surgeon  Anesthesia Plan Comments: (  )        Anesthesia Quick Evaluation

## 2018-09-07 NOTE — Transfer of Care (Signed)
Immediate Anesthesia Transfer of Care Note  Patient: Ashley AuerbachDeborah C Lin  Procedure(s) Performed: LAPAROSCOPIC CHOLECYSTECTOMY (N/A Abdomen)  Patient Location: PACU  Anesthesia Type:General  Level of Consciousness: drowsy and patient cooperative  Airway & Oxygen Therapy: Patient Spontanous Breathing and Patient connected to face mask oxygen  Post-op Assessment: Report given to RN and Post -op Vital signs reviewed and stable  Post vital signs: Reviewed and stable  Last Vitals:  Vitals Value Taken Time  BP    Temp 36.3 C 09/07/2018 10:52 AM  Pulse 59 09/07/2018 10:52 AM  Resp 24 09/07/2018 10:52 AM  SpO2 100 % 09/07/2018 10:52 AM    Last Pain:  Vitals:   09/07/18 0842  TempSrc:   PainSc: 0-No pain      Patients Stated Pain Goal: 3 (09/07/18 0842)  Complications: No apparent anesthesia complications

## 2018-09-08 ENCOUNTER — Encounter (HOSPITAL_COMMUNITY): Payer: Self-pay | Admitting: General Surgery

## 2019-07-16 LAB — HM MAMMOGRAPHY

## 2019-11-03 ENCOUNTER — Telehealth: Payer: Self-pay | Admitting: Family Medicine

## 2019-11-03 NOTE — Telephone Encounter (Signed)
Pt stated she was a pt of yours then transferred to dr Dayton Martes because it was closer to her home.  She wants to transfer back to to you  [facility] to schedule [facility] appointment?

## 2019-11-04 NOTE — Telephone Encounter (Signed)
Yes.. but okay to just schedule as CPX with labs prior since she was my pt previously in 2018

## 2019-11-05 NOTE — Telephone Encounter (Signed)
Left message asking pt to call office  °

## 2019-11-11 NOTE — Telephone Encounter (Signed)
Labs 6/11 cpx 6/15 Pt aware

## 2020-03-09 ENCOUNTER — Telehealth: Payer: Self-pay | Admitting: Family Medicine

## 2020-03-09 DIAGNOSIS — Z13220 Encounter for screening for lipoid disorders: Secondary | ICD-10-CM

## 2020-03-09 NOTE — Telephone Encounter (Signed)
-----   Message from Alvina Chou sent at 02/23/2020 12:29 PM EDT ----- Regarding: Lab orders for Friday, 6.11.21 Patient is scheduled for CPX labs, please order future labs, Thanks , Camelia Eng

## 2020-03-10 ENCOUNTER — Other Ambulatory Visit: Payer: Self-pay

## 2020-03-10 ENCOUNTER — Other Ambulatory Visit (INDEPENDENT_AMBULATORY_CARE_PROVIDER_SITE_OTHER): Payer: Self-pay

## 2020-03-10 DIAGNOSIS — Z13220 Encounter for screening for lipoid disorders: Secondary | ICD-10-CM

## 2020-03-10 NOTE — Addendum Note (Signed)
Addended by: Alvina Chou on: 03/10/2020 02:34 PM   Modules accepted: Orders

## 2020-03-11 LAB — COMPREHENSIVE METABOLIC PANEL
AG Ratio: 1.9 (calc) (ref 1.0–2.5)
ALT: 31 U/L — ABNORMAL HIGH (ref 6–29)
AST: 28 U/L (ref 10–35)
Albumin: 4.5 g/dL (ref 3.6–5.1)
Alkaline phosphatase (APISO): 66 U/L (ref 37–153)
BUN/Creatinine Ratio: 13 (calc) (ref 6–22)
BUN: 14 mg/dL (ref 7–25)
CO2: 26 mmol/L (ref 20–32)
Calcium: 9.6 mg/dL (ref 8.6–10.4)
Chloride: 105 mmol/L (ref 98–110)
Creat: 1.1 mg/dL — ABNORMAL HIGH (ref 0.50–0.99)
Globulin: 2.4 g/dL (calc) (ref 1.9–3.7)
Glucose, Bld: 103 mg/dL — ABNORMAL HIGH (ref 65–99)
Potassium: 4.8 mmol/L (ref 3.5–5.3)
Sodium: 142 mmol/L (ref 135–146)
Total Bilirubin: 0.4 mg/dL (ref 0.2–1.2)
Total Protein: 6.9 g/dL (ref 6.1–8.1)

## 2020-03-11 LAB — LIPID PANEL
Cholesterol: 195 mg/dL (ref <200)
HDL: 56 mg/dL (ref >=50)
LDL Cholesterol (Calc): 114 mg/dL (calc) — ABNORMAL HIGH
Non-HDL Cholesterol (Calc): 139 mg/dL (calc) — ABNORMAL HIGH (ref <130)
Total CHOL/HDL Ratio: 3.5 (calc) (ref <5.0)
Triglycerides: 130 mg/dL (ref <150)

## 2020-03-13 NOTE — Progress Notes (Signed)
No critical labs need to be addressed urgently. We will discuss labs in detail at upcoming office visit.   

## 2020-03-14 ENCOUNTER — Telehealth: Payer: Self-pay

## 2020-03-14 ENCOUNTER — Ambulatory Visit (INDEPENDENT_AMBULATORY_CARE_PROVIDER_SITE_OTHER): Payer: 59 | Admitting: Family Medicine

## 2020-03-14 ENCOUNTER — Other Ambulatory Visit: Payer: Self-pay

## 2020-03-14 ENCOUNTER — Encounter: Payer: Self-pay | Admitting: Family Medicine

## 2020-03-14 VITALS — BP 100/60 | HR 74 | Temp 98.1°F | Ht 64.25 in | Wt 172.5 lb

## 2020-03-14 DIAGNOSIS — Z0000 Encounter for general adult medical examination without abnormal findings: Secondary | ICD-10-CM | POA: Diagnosis not present

## 2020-03-14 MED ORDER — VALACYCLOVIR HCL 1 G PO TABS: 1000.0000 mg | ORAL_TABLET | Freq: Two times a day (BID) | ORAL | 0 refills | Status: DC

## 2020-03-14 NOTE — Progress Notes (Signed)
Chief Complaint  Patient presents with  . Annual Exam    History of Present Illness: HPI  The patient is here for annual wellness exam and preventative care.    ADD:  Tolerable control off Adderall.   Office Visit from 03/14/2020 in [facility] Total Score 0    She has retired from work.   Prediabetes   Glucose 103 stable  ALT slightly elevated at 31.  No tylenol. ETOH 1-2 a day  no family history of liver issues.  Elevated Cholesterol:  Low risk CAD on no med. Lab Results  Component Value Date   CHOL 195 03/10/2020   HDL 56 03/10/2020   LDLCALC 114 (H) 03/10/2020   LDLDIRECT 130.9 11/09/2013   TRIG 130 03/10/2020   CHOLHDL 3.5 03/10/2020  The 10-year ASCVD risk score Denman George DC Jr., et al., 2013) is: 2.2%   Values used to calculate the score:     Age: 64 years     Sex: Female     Is Non-Hispanic African American: No     Diabetic: No     Tobacco smoker: No     Systolic Blood Pressure: 100 mmHg     Is BP treated: No     HDL Cholesterol: 56 mg/dL     Total Cholesterol: 195 mg/dL Using medications without problems: Muscle aches:  Diet compliance:moderate Exercise: walking , plans to start swimming Other complaints:   This visit occurred during the SARS-CoV-2 public health emergency.  Safety protocols were in place, including screening questions prior to the visit, additional usage of staff PPE, and extensive cleaning of exam room while observing appropriate contact time as indicated for disinfecting solutions.   COVID 19 screen:  No recent travel or known exposure to COVID19 The patient denies respiratory symptoms of COVID 19 at this time. The importance of social distancing was discussed today.     Review of Systems  Constitutional: Negative for chills and fever.  HENT: Negative for congestion and ear pain.   Eyes: Negative for pain and redness.  Respiratory: Negative for cough and shortness of breath.   Cardiovascular: Negative  for chest pain, palpitations and leg swelling.  Gastrointestinal: Negative for abdominal pain, blood in stool, constipation, diarrhea, nausea and vomiting.  Genitourinary: Negative for dysuria.  Musculoskeletal: Negative for falls and myalgias.  Skin: Negative for rash.  Neurological: Negative for dizziness.  Psychiatric/Behavioral: Negative for depression. The patient is not nervous/anxious.       Past Medical History:  Diagnosis Date  . Abdominal discomfort   . Endometriosis    hx    reports that she has never smoked. She has never used smokeless tobacco. She reports current alcohol use. She reports that she does not use drugs.   Current Outpatient Medications:  .  diphenhydrAMINE (BENADRYL) 25 MG tablet, Take 25 mg by mouth every 6 (six) hours as needed for allergies., Disp: , Rfl:  .  Ibuprofen 200 MG CAPS, Take 400 mg by mouth every 6 (six) hours as needed for moderate pain., Disp: , Rfl:  .  valACYclovir (VALTREX) 1000 MG tablet, Take 1,000 mg by mouth daily as needed (cold sores and shingles). , Disp: , Rfl: 4   Observations/Objective: Blood pressure 100/60, pulse 74, temperature 98.1 F (36.7 C), temperature source Temporal, height 5' 4.25" (1.632 m), weight 172 lb 8 oz (78.2 kg), SpO2 98 %.  Physical Exam Constitutional:      General: She is not in acute distress.  Appearance: Normal appearance. She is well-developed. She is not ill-appearing or toxic-appearing.  HENT:     Head: Normocephalic.     Right Ear: Hearing, tympanic membrane, ear canal and external ear normal.     Left Ear: Hearing, tympanic membrane, ear canal and external ear normal.     Nose: Nose normal.  Eyes:     General: Lids are normal. Lids are everted, no foreign bodies appreciated.     Conjunctiva/sclera: Conjunctivae normal.     Pupils: Pupils are equal, round, and reactive to light.  Neck:     Thyroid: No thyroid mass or thyromegaly.     Vascular: No carotid bruit.     Trachea: Trachea  normal.  Cardiovascular:     Rate and Rhythm: Normal rate and regular rhythm.     Heart sounds: Normal heart sounds, S1 normal and S2 normal. No murmur heard.  No gallop.   Pulmonary:     Effort: Pulmonary effort is normal. No respiratory distress.     Breath sounds: Normal breath sounds. No wheezing, rhonchi or rales.  Abdominal:     General: Bowel sounds are normal. There is no distension or abdominal bruit.     Palpations: Abdomen is soft. There is no fluid wave or mass.     Tenderness: There is no abdominal tenderness. There is no guarding or rebound.     Hernia: No hernia is present.  Musculoskeletal:     Cervical back: Normal range of motion and neck supple.  Lymphadenopathy:     Cervical: No cervical adenopathy.  Skin:    General: Skin is warm and dry.     Findings: No rash.  Neurological:     Mental Status: She is alert.     Cranial Nerves: No cranial nerve deficit.     Sensory: No sensory deficit.  Psychiatric:        Mood and Affect: Mood is not anxious or depressed.        Speech: Speech normal.        Behavior: Behavior normal. Behavior is cooperative.        Judgment: Judgment normal.      Assessment and Plan The patient's preventative maintenance and recommended screening tests for an annual wellness exam were reviewed in full today. Brought up to date unless services declined.  Counselled on the importance of diet, exercise, and its role in overall health and mortality. The patient's FH and SH was reviewed, including their home life, tobacco status, and drug and alcohol status.   Vaccines: uptodate, S/P COVID19 vaccine, consider shingles vaccine Pap/DVE:  partial hysterectomy, has left ovary. No pap indicated. Mammo:  2020.. Dr. Katina Degree.. plans to do elswhere Bone Density:  no family history of osteoporosis... start  Age 33 years Colon: 04/2016, repeat in 5 years Dr. Paulita Fujita  Smoking Status: none ETOH/ drug use: occ/none Hep C:   neg HIV screen:   declined     Eliezer Lofts, MD

## 2020-03-14 NOTE — Telephone Encounter (Signed)
[facility] Night - Client Nonclinical Telephone Record AccessNurse Client [facility] Night - Client Client Site [facility] - Night Physician Kerby Nora - MD Contact Type Call Who Is Calling Patient / Member / Family / Caregiver Caller Name Divinity Kyler Caller Phone Number [phone] Call Type Message Only Information Provided Reason for Call Returning a Call from the Office Initial Comment Caller is returning a call from [facility] Additional Comment Disp. Time Disposition Final User 03/13/2020 5:24:37 PM General Information Provided Yes Darryl Lent Call Closed By: Darryl Lent Transaction Date/Time: 03/13/2020 5:23:05 PM (ET)

## 2020-03-14 NOTE — Patient Instructions (Addendum)
Decrease alcohol use as able.  Work on low cholesterol diet.  Work on weight loss and regular exercise.  Look into shingrix vaccine coverage.  Please call the location of your choice from the menu below to schedule your Mammogram and/or Bone Density appointment.    [place]   1. [facility]                      Phone:  [phone] [address]                               [place], [place] [address]                                                             Services: Traditional and 3D Mammogram, Bone Density   2. [facility] D[facility]                 Phone: [phone] [address]                                                       [place], [place] [address]    Service: Bone Density ONLY   *this site does NOT perform mammograms  3. [facility]                        Phone:  [phone] [address]                                  [place], [place] [address]                                            Services:  3D Mammogram and Bone Density    [place]  1. [facility]   Phone:  [phone]   [address]                                                                            [place], [place] [address]                                            Services: 3D Mammogram and Bone Density  2. [facility]  Phone:  [phone]   [address]  [place], [place] [address]                                              Services:  3D Mammogram and Bone Density

## 2020-03-15 NOTE — Telephone Encounter (Signed)
I had attempted to contact patient to perform covid screening - patient was seen in office yesterday. Nothing further needed.

## 2020-03-20 ENCOUNTER — Encounter: Payer: Self-pay | Admitting: Family Medicine

## 2020-07-11 ENCOUNTER — Other Ambulatory Visit: Payer: Self-pay | Admitting: Family Medicine

## 2020-07-11 DIAGNOSIS — Z1231 Encounter for screening mammogram for malignant neoplasm of breast: Secondary | ICD-10-CM

## 2020-08-04 ENCOUNTER — Other Ambulatory Visit: Payer: Self-pay

## 2020-08-04 ENCOUNTER — Ambulatory Visit
Admission: RE | Admit: 2020-08-04 | Discharge: 2020-08-04 | Disposition: A | Discharge status: Home / Self Care | Payer: 59 | Source: Ambulatory Visit | Attending: Family Medicine | Admitting: Family Medicine

## 2020-08-04 DIAGNOSIS — Z1231 Encounter for screening mammogram for malignant neoplasm of breast: Secondary | ICD-10-CM

## 2021-02-01 ENCOUNTER — Other Ambulatory Visit: Payer: Self-pay | Admitting: Family Medicine

## 2021-02-01 NOTE — Telephone Encounter (Signed)
Last office visit 03/14/2020 for CPE.  Last refilled 03/14/2020 for #20 with no refills.  No future appointments.

## 2021-09-11 ENCOUNTER — Other Ambulatory Visit: Payer: Self-pay | Admitting: Family Medicine

## 2021-11-01 ENCOUNTER — Telehealth: Payer: Self-pay | Admitting: Family Medicine

## 2021-11-01 NOTE — Telephone Encounter (Signed)
°  Encourage patient to contact the pharmacy for refills or they can request refills through [facility]  LAST APPOINTMENT DATE:  Please schedule appointment if longer than 1 year  NEXT APPOINTMENT DATE:  MEDICATION:valACYclovir (VALTREX) 1000 MG tablet  Is the patient out of medication?   PHARMACY:[id number] - [place], [place] - [address] AT [facility]  Let patient know to contact pharmacy at the end of the day to make sure medication is ready.  Please notify patient to allow 48-72 hours to process  CLINICAL FILLS OUT ALL BELOW:   LAST REFILL:  QTY:  REFILL DATE:    OTHER COMMENTS:    Okay for refill?  Please advise

## 2021-11-01 NOTE — Telephone Encounter (Signed)
Patient has not been seen since 03/14/2020.  Please call patient back and schedule CPE with fasting labs prior.  Per Dot Phrase on Last Appointment Date:  Please schedule appointment if longer that a 1 year.  Unable to refill meds until patient is seen.

## 2021-11-01 NOTE — Addendum Note (Signed)
Addended by: Damita Lack on: 11/01/2021 11:09 AM   Modules accepted: Orders

## 2021-11-05 NOTE — Telephone Encounter (Signed)
Called to schedule pt, she stated that she didn't want to schedule an appt

## 2022-02-21 ENCOUNTER — Other Ambulatory Visit: Payer: Self-pay | Admitting: Family Medicine

## 2022-02-21 DIAGNOSIS — Z1231 Encounter for screening mammogram for malignant neoplasm of breast: Secondary | ICD-10-CM

## 2022-02-27 ENCOUNTER — Ambulatory Visit
Admission: RE | Admit: 2022-02-27 | Discharge: 2022-02-27 | Disposition: A | Discharge status: Home / Self Care | Payer: BC Managed Care – PPO | Source: Ambulatory Visit | Attending: Family Medicine | Admitting: Family Medicine

## 2022-02-27 DIAGNOSIS — Z1231 Encounter for screening mammogram for malignant neoplasm of breast: Secondary | ICD-10-CM

## 2022-03-04 ENCOUNTER — Other Ambulatory Visit: Payer: Self-pay | Admitting: Family Medicine

## 2022-03-04 DIAGNOSIS — R928 Other abnormal and inconclusive findings on diagnostic imaging of breast: Secondary | ICD-10-CM

## 2022-03-11 ENCOUNTER — Ambulatory Visit
Admission: RE | Admit: 2022-03-11 | Discharge: 2022-03-11 | Disposition: A | Discharge status: Home / Self Care | Payer: BC Managed Care – PPO | Source: Ambulatory Visit | Attending: Family Medicine | Admitting: Family Medicine

## 2022-03-11 ENCOUNTER — Ambulatory Visit: Payer: BC Managed Care – PPO

## 2022-03-11 DIAGNOSIS — R928 Other abnormal and inconclusive findings on diagnostic imaging of breast: Secondary | ICD-10-CM

## 2022-06-13 ENCOUNTER — Encounter: Payer: Self-pay | Admitting: Family Medicine

## 2022-07-04 LAB — HM COLONOSCOPY

## 2022-07-15 ENCOUNTER — Encounter: Payer: Self-pay | Admitting: Family Medicine

## 2022-08-27 NOTE — Progress Notes (Unsigned)
    Kasee Hantz T. Sianni Cloninger, MD, CAQ Sports Medicine [facility] [address] [place], [address]  Phone: [phone]  FAX: [phone]  Ashley Lin - 66 y.o. female  MRN [id number]  Date of Birth: Aug 15, 1956  Date: 08/28/2022  PCP: Excell Seltzer, MD  Referral: Excell Seltzer, MD  No chief complaint on file.  Subjective:   Ashley Lin is a 66 y.o. very pleasant female patient with There is no height or weight on file to calculate BMI. who presents with the following:  Patient presents with low back pain.    Review of Systems is noted in the HPI, as appropriate  Objective:   There were no vitals taken for this visit.  GEN: No acute distress; alert,appropriate. PULM: Breathing comfortably in no respiratory distress PSYCH: Normally interactive.   Laboratory and Imaging Data:  Assessment and Plan:   ***

## 2022-08-28 ENCOUNTER — Encounter: Payer: Self-pay | Admitting: Family Medicine

## 2022-08-28 ENCOUNTER — Ambulatory Visit (INDEPENDENT_AMBULATORY_CARE_PROVIDER_SITE_OTHER): Payer: BC Managed Care – PPO | Admitting: Family Medicine

## 2022-08-28 VITALS — BP 130/80 | HR 70 | Temp 98.3°F | Ht 64.25 in | Wt 177.5 lb

## 2022-08-28 DIAGNOSIS — M533 Sacrococcygeal disorders, not elsewhere classified: Secondary | ICD-10-CM

## 2022-08-28 MED ORDER — VALACYCLOVIR HCL 1 G PO TABS: ORAL_TABLET | ORAL | 0 refills | Status: DC

## 2023-01-27 IMAGING — MG MM DIGITAL DIAGNOSTIC UNILAT*L* W/ TOMO W/ CAD
8 series · 8 of 24 positions shown · non-contrast
Comparison: Previous exam(s).

CLINICAL DATA: Recall from screening mammography, possible focal
asymmetry involving the outer LEFT breast at middle depth.

EXAM:
DIGITAL DIAGNOSTIC UNILATERAL LEFT MAMMOGRAM WITH TOMOSYNTHESIS AND
CAD
TECHNIQUE: Left digital diagnostic mammography and breast tomosynthesis was
performed. The images were evaluated with computer-aided detection.

[L MLO synth-2D]
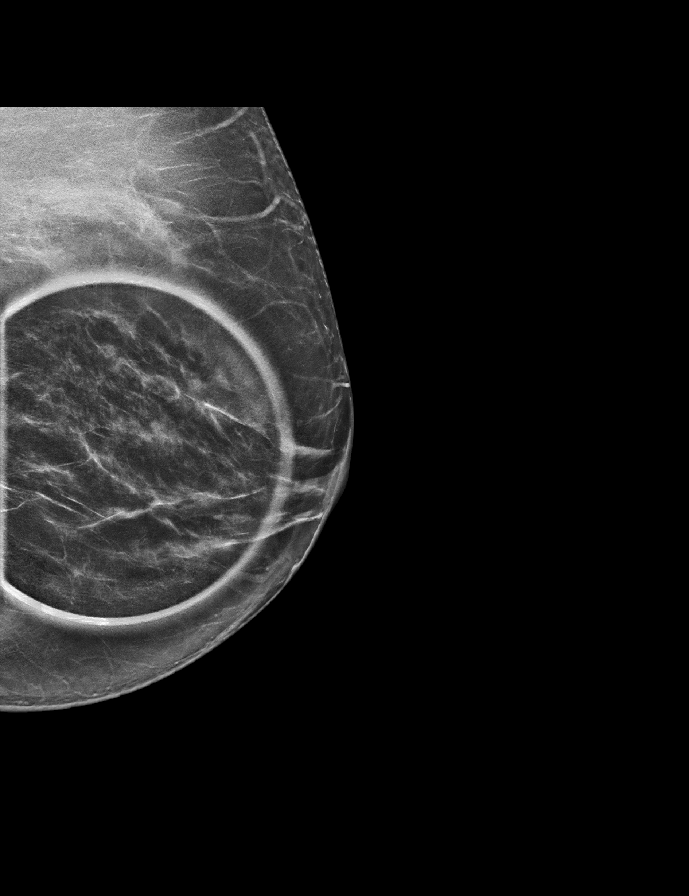

[L CC synth-2D (1 of 2)]
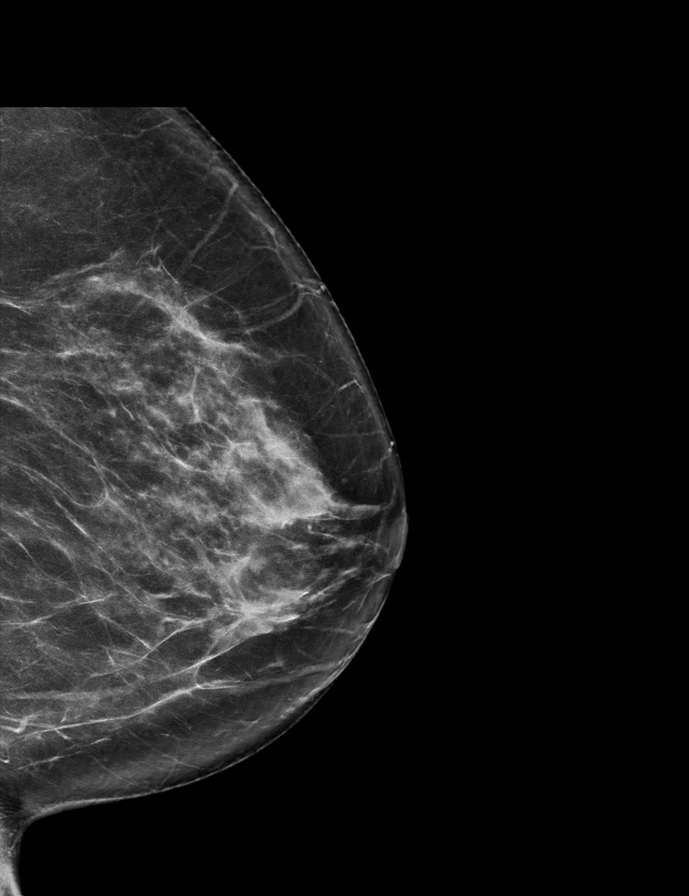

[L CC synth-2D (2 of 2)]
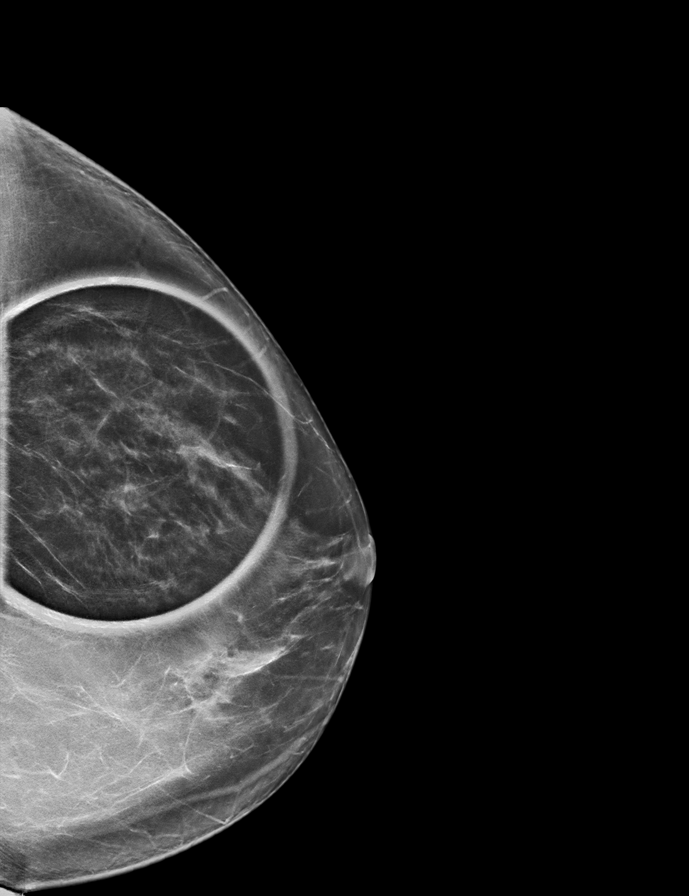

[L ML synth-2D]
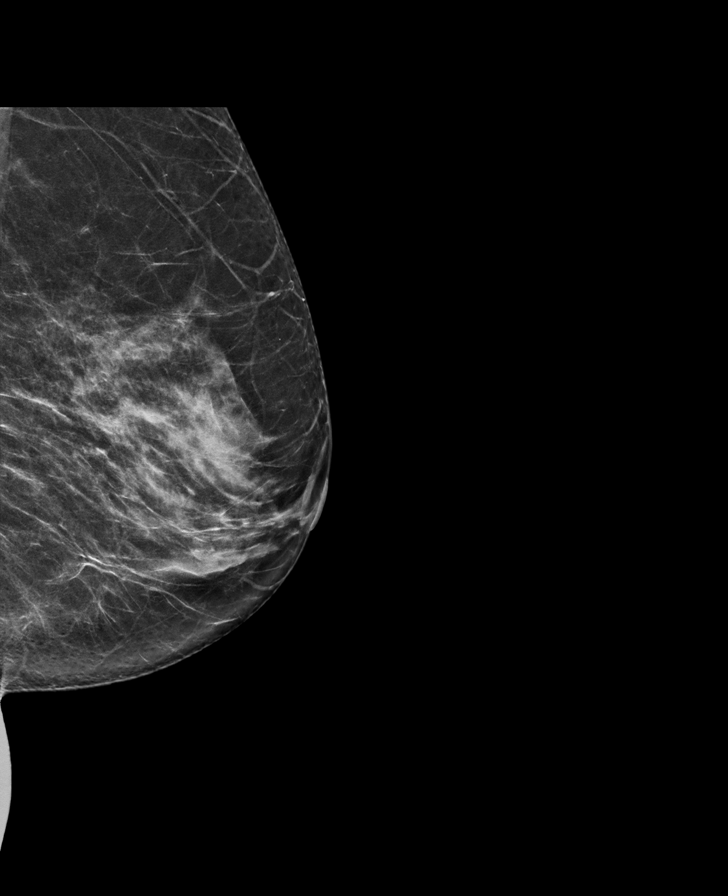

[L CC tomo (1 of 2) · tomo slice 35/69.0]
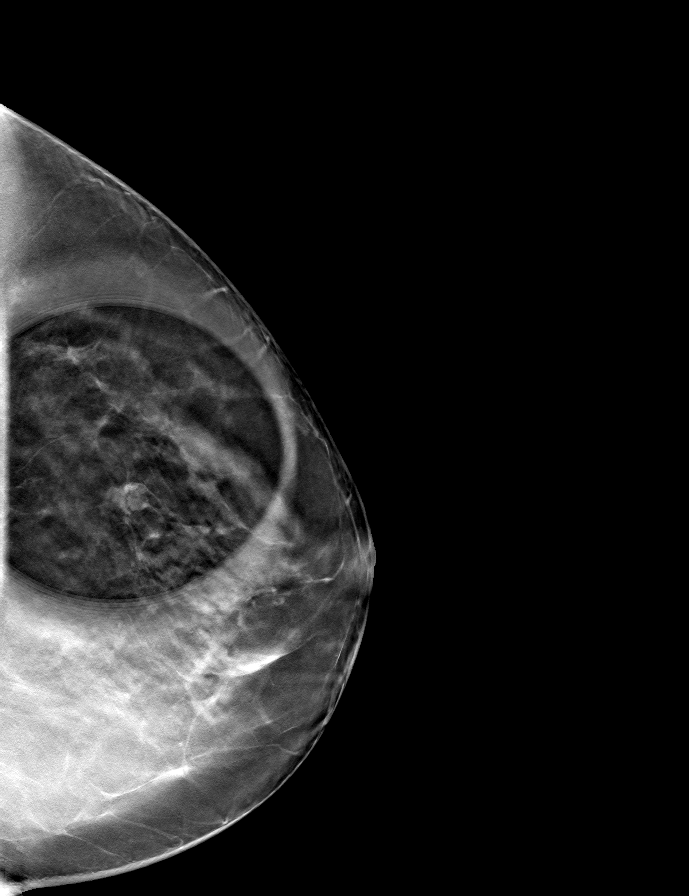

[L CC tomo (2 of 2) · tomo slice 39/76.0]
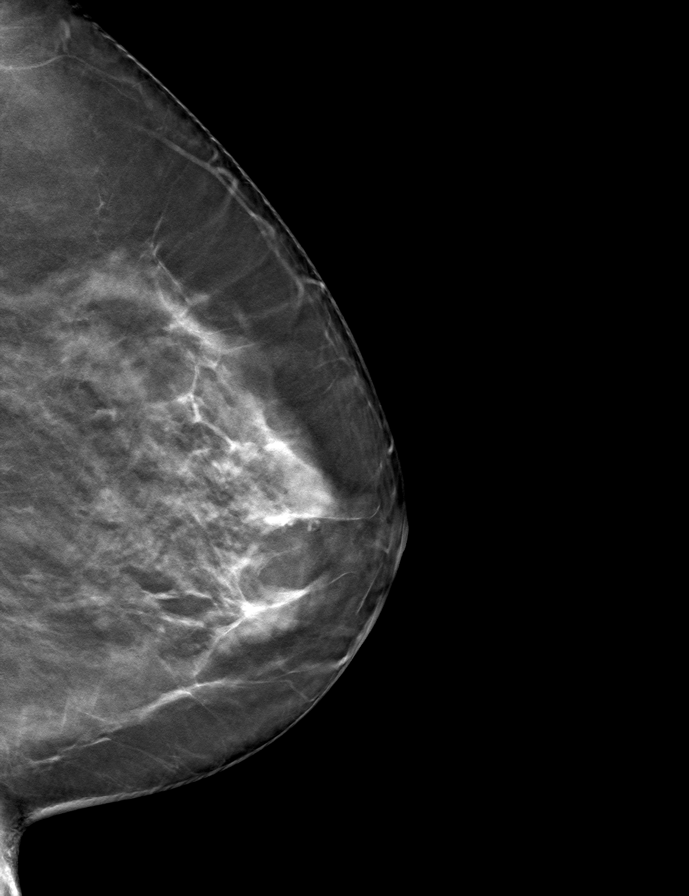

[L MLO tomo · tomo slice 34/67.0]
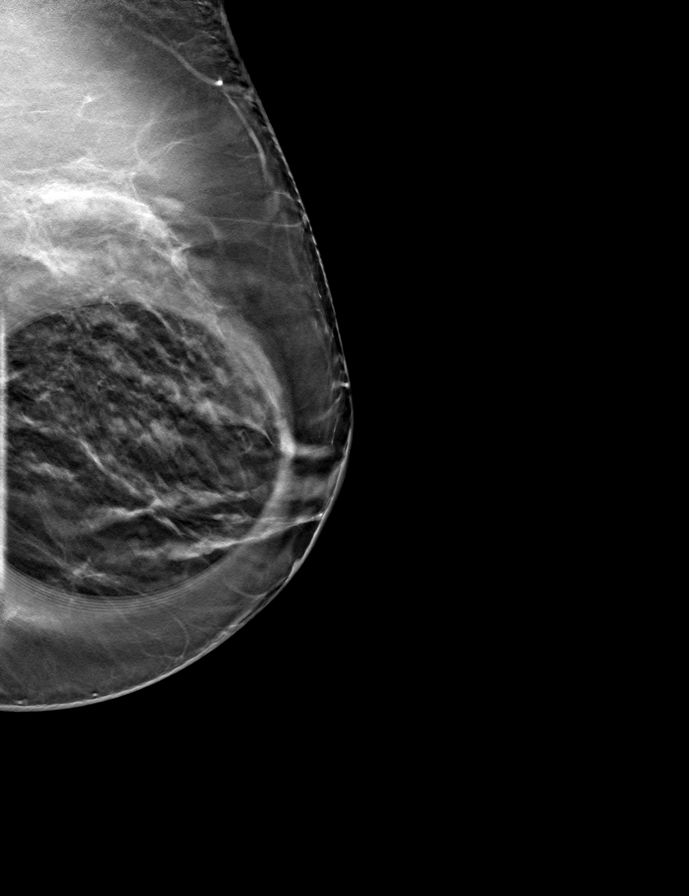

[L ML tomo · tomo slice 37/73.0]
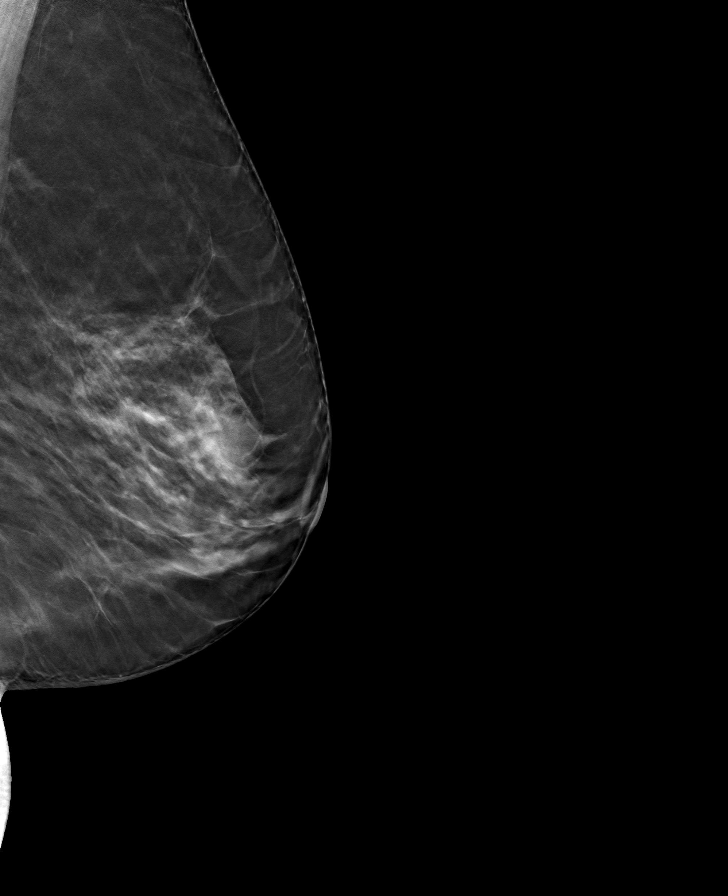

[8 of 24 positions shown; findings below may reference images not displayed]

ACR Breast Density Category c: The breast tissue is heterogeneously
dense, which may obscure small masses.
FINDINGS: Spot-compression CC and MLO views of the area of concern and full
field mediolateral and laterally rolled CC views were obtained.

The focal asymmetry in the outer breast at middle depth partially
disperses on the spot compression views, indicating overlapping
fibroglandular tissue. There is no underlying mass or architectural
distortion. The asymmetry is inconspicuous on the full field
mediolateral view and the laterally rolled CC view, confirming
overlapping fibroglandular tissue.
IMPRESSION: No mammographic evidence of malignancy involving the LEFT breast.

RECOMMENDATION:
Screening mammogram in one year.(Code:RD-[id number])

I have discussed the findings and recommendations with the patient.
If applicable, a reminder letter will be sent to the patient
regarding the next appointment.

BI-RADS CATEGORY  1: Negative.

## 2023-05-09 ENCOUNTER — Encounter: Payer: Self-pay | Admitting: Family Medicine

## 2023-05-09 ENCOUNTER — Ambulatory Visit (INDEPENDENT_AMBULATORY_CARE_PROVIDER_SITE_OTHER): Payer: BC Managed Care – PPO | Admitting: Family Medicine

## 2023-05-09 VITALS — BP 124/72 | HR 83 | Temp 97.9°F | Ht 64.25 in | Wt 183.4 lb

## 2023-05-09 DIAGNOSIS — J309 Allergic rhinitis, unspecified: Secondary | ICD-10-CM

## 2023-05-09 DIAGNOSIS — R509 Fever, unspecified: Secondary | ICD-10-CM | POA: Diagnosis not present

## 2023-05-09 DIAGNOSIS — U071 COVID-19: Secondary | ICD-10-CM | POA: Insufficient documentation

## 2023-05-09 LAB — POC COVID19 BINAXNOW: SARS Coronavirus 2 Ag: POSITIVE — AB

## 2023-05-09 MED ORDER — GUAIFENESIN-CODEINE 100-10 MG/5ML PO SYRP: 5.0000 mL | ORAL_SOLUTION | Freq: Every evening | ORAL | 0 refills | Status: DC | PRN

## 2023-05-09 NOTE — Progress Notes (Signed)
Patient ID: Ashley Lin, female    DOB: [birth year], 67 y.o.   MRN: [id number]  This visit was conducted in person.  BP 124/72 (BP Location: Left Arm, Patient Position: Sitting, Cuff Size: Normal)   Pulse 83   Temp 97.9 F (36.6 C) (Temporal)   Ht 5' 4.25" (1.632 m)   Wt 183 lb 6 oz (83.2 kg)   SpO2 97%   BMI 31.23 kg/m    CC:  Chief Complaint  Patient presents with   Sore Throat    Negative Home Covid Test yesterday   Nasal Congestion        Ear Pain   Watery Eyes   Cough    Subjective:   HPI: Ashley Lin is a 67 y.o. female presenting on 05/09/2023 for Sore Throat (Negative Home Covid Test yesterday), Nasal Congestion (/), Ear Pain, Watery Eyes, and Cough   Has had intermittent issues in last 2-3 months.. notes after being outside, mowing lawn.  Has continuous mucus and persistent ST  Date of onset:  now new fever in last 3 days.. 100.4 F. Increased cough, teeth on top, left ear pain, left face pain, headache, ST  Some SOB, no wheeze.    Sick contacts:  none COVID testing:   old COVID test negative.. was out of date     She has tried to treat with  OTC mucinex  Has not tried an allergy medication.    No history of chronic lung disease such as asthma or COPD. Non-smoker.       Relevant past medical, surgical, family and social history reviewed and updated as indicated. Interim medical history since our last visit reviewed. Allergies and medications reviewed and updated. Outpatient Medications Prior to Visit  Medication Sig Dispense Refill   diphenhydrAMINE (BENADRYL) 25 MG tablet Take 25 mg by mouth every 6 (six) hours as needed for allergies.     Ibuprofen 200 MG CAPS Take 400 mg by mouth every 6 (six) hours as needed for moderate pain.     omeprazole (PRILOSEC OTC) 20 MG tablet Take 20 mg by mouth daily.     valACYclovir (VALTREX) 1000 MG tablet TAKE 1 TABLET(1000 MG) BY MOUTH TWICE DAILY 20 tablet 0   No facility-administered  medications prior to visit.     Per HPI unless specifically indicated in ROS section below Review of Systems  Constitutional:  Negative for fatigue and fever.  HENT:  Negative for congestion.   Eyes:  Negative for pain.  Respiratory:  Negative for cough and shortness of breath.   Cardiovascular:  Negative for chest pain, palpitations and leg swelling.  Gastrointestinal:  Negative for abdominal pain.  Genitourinary:  Negative for dysuria and vaginal bleeding.  Musculoskeletal:  Negative for back pain.  Neurological:  Negative for syncope, light-headedness and headaches.  Psychiatric/Behavioral:  Negative for dysphoric mood.    Objective:  BP 124/72 (BP Location: Left Arm, Patient Position: Sitting, Cuff Size: Normal)   Pulse 83   Temp 97.9 F (36.6 C) (Temporal)   Ht 5' 4.25" (1.632 m)   Wt 183 lb 6 oz (83.2 kg)   SpO2 97%   BMI 31.23 kg/m   Wt Readings from Last 3 Encounters:  05/09/23 183 lb 6 oz (83.2 kg)  08/28/22 177 lb 8 oz (80.5 kg)  03/14/20 172 lb 8 oz (78.2 kg)      Physical Exam Constitutional:      General: She is not in acute distress.  Appearance: She is well-developed. She is not ill-appearing or toxic-appearing.  HENT:     Head: Normocephalic.     Right Ear: Hearing, ear canal and external ear normal. A middle ear effusion is present. Tympanic membrane is not erythematous, retracted or bulging.     Left Ear: Hearing, ear canal and external ear normal. A middle ear effusion is present. Tympanic membrane is not erythematous, retracted or bulging.     Nose: Mucosal edema and rhinorrhea present.     Right Sinus: No maxillary sinus tenderness or frontal sinus tenderness.     Left Sinus: No maxillary sinus tenderness or frontal sinus tenderness.     Mouth/Throat:     Mouth: Oropharynx is clear and moist and mucous membranes are normal.     Pharynx: Uvula midline.  Eyes:     General: Lids are normal. Lids are everted, no foreign bodies appreciated.      Extraocular Movements: EOM normal.     Conjunctiva/sclera: Conjunctivae normal.     Pupils: Pupils are equal, round, and reactive to light.  Neck:     Thyroid: No thyroid mass or thyromegaly.     Vascular: No carotid bruit.     Trachea: Trachea normal.  Cardiovascular:     Rate and Rhythm: Normal rate and regular rhythm.     Pulses: Normal pulses.     Heart sounds: Normal heart sounds, S1 normal and S2 normal. No murmur heard.    No friction rub. No gallop.  Pulmonary:     Effort: Pulmonary effort is normal. No tachypnea or respiratory distress.     Breath sounds: Normal breath sounds. No decreased breath sounds, wheezing, rhonchi or rales.  Musculoskeletal:     Cervical back: Normal range of motion and neck supple.  Skin:    General: Skin is warm, dry and intact.     Findings: No rash.  Neurological:     Mental Status: She is alert.  Psychiatric:        Mood and Affect: Mood is not anxious or depressed.        Speech: Speech normal.        Behavior: Behavior normal. Behavior is cooperative.        Cognition and Memory: Cognition and memory normal.        Judgment: Judgment normal.       Results for orders placed or performed in visit on 05/09/23  POC COVID-19  Result Value Ref Range   SARS Coronavirus 2 Ag Positive (A) Negative    Assessment and Plan  Fever, unspecified fever cause -     POC COVID-19 BinaxNow  Allergic rhinitis, unspecified seasonality, unspecified trigger Assessment & Plan: Current acute infection but underlying allergies.  Start Zyrtec 10 mg p.o. nightly and start Flonase 2 sprays per nostril daily.   COVID-19 Assessment & Plan:  Acute, COVID19  infection  No clear sign of bacterial infection at this time.  . No SOB.  No red flags/need for ER visit or in-person exam at respiratory clinic at this time..    Pt low risk for COVID complications , only risk factor is age at 89 and weight. She has opted against starting oral antiviral medication.   Will treat symptomatically with Tylenol, Flonase Mucinex DM for daytime cough and prescription for codeine with guaifenesin sent to her pharmacy. If SOB begins symptoms worsening.. have low threshold for in-person exam, if severe shortness of breath ER visit recommended.  Can monitor Oxygen saturation at home with  home monitor if able to obtain.  Go to ER if O2 sat < 90% on room air.  Reviewed home care and provided information regarding contagiousness 5 days isolation recommended. Return to work day 6 and wear mask for 4 more days to complete 10 days. Provided info about prevention of spread of COVID 19.    Other orders -     guaiFENesin-Codeine; Take 5-10 mLs by mouth at bedtime as needed for cough.  Dispense: 180 mL; Refill: 0    No follow-ups on file.   Kerby Nora, MD

## 2023-05-09 NOTE — Patient Instructions (Addendum)
Start Zyrtec 10 mg p.o. nightly. Start Flonase 2 sprays per nostril daily. Push fluids, rest.  Can use cough suppressant at night  for cough.  Go to ER of severe shortness of breath.

## 2023-05-09 NOTE — Assessment & Plan Note (Signed)
Acute, COVID19  infection  No clear sign of bacterial infection at this time.  . No SOB.  No red flags/need for ER visit or in-person exam at respiratory clinic at this time..    Pt low risk for COVID complications , only risk factor is age at 49 and weight. She has opted against starting oral antiviral medication.  Will treat symptomatically with Tylenol, Flonase Mucinex DM for daytime cough and prescription for codeine with guaifenesin sent to her pharmacy. If SOB begins symptoms worsening.. have low threshold for in-person exam, if severe shortness of breath ER visit recommended.  Can monitor Oxygen saturation at home with home monitor if able to obtain.  Go to ER if O2 sat < 90% on room air.  Reviewed home care and provided information regarding contagiousness 5 days isolation recommended. Return to work day 6 and wear mask for 4 more days to complete 10 days. Provided info about prevention of spread of COVID 19.

## 2023-05-09 NOTE — Assessment & Plan Note (Signed)
Current acute infection but underlying allergies.  Start Zyrtec 10 mg p.o. nightly and start Flonase 2 sprays per nostril daily.

## 2023-07-24 ENCOUNTER — Other Ambulatory Visit: Payer: Self-pay | Admitting: Family Medicine

## 2023-07-24 ENCOUNTER — Ambulatory Visit
Admission: RE | Admit: 2023-07-24 | Discharge: 2023-07-24 | Disposition: A | Discharge status: Home / Self Care | Payer: PPO | Source: Ambulatory Visit | Attending: Family Medicine | Admitting: Family Medicine

## 2023-07-24 DIAGNOSIS — Z1231 Encounter for screening mammogram for malignant neoplasm of breast: Secondary | ICD-10-CM

## 2023-08-22 ENCOUNTER — Other Ambulatory Visit: Payer: Self-pay | Admitting: Family Medicine

## 2023-08-22 MED ORDER — VALACYCLOVIR HCL 1 G PO TABS: ORAL_TABLET | ORAL | 0 refills | Status: DC

## 2023-08-22 NOTE — Telephone Encounter (Signed)
Prescription Request  08/22/2023  LOV: 05/09/2023  What is the name of the medication or equipment? valACYclovir (VALTREX) 1000 MG tablet   Have you contacted your pharmacy to request a refill? No   Which pharmacy would you like this sent to?  [id number] [place], [place] - [address] AT [facility] [address] [place] [address] Phone: [phone] Fax: [phone]    Patient notified that their request is being sent to the clinical staff for review and that they should receive a response within 2 business days.   Please advise at [facility] [phone]  Patient stated that she gets fever blisters a lot,and was prescribed this medication for them.She would like to know if a rx could be sent to pharmacy for her.She said that she has a fever blister on her mouth now.

## 2023-08-22 NOTE — Telephone Encounter (Signed)
Last office visit 05/09/2023 for Covid.  Last refilled 08/28/2022 for #20 with no refills.  Next Appt: No future appointments.

## 2023-10-29 ENCOUNTER — Ambulatory Visit: Payer: PPO | Admitting: Family Medicine

## 2023-10-29 ENCOUNTER — Other Ambulatory Visit: Payer: Self-pay | Admitting: Family Medicine

## 2023-10-29 ENCOUNTER — Ambulatory Visit: Payer: PPO

## 2023-10-29 ENCOUNTER — Encounter: Payer: Self-pay | Admitting: Family Medicine

## 2023-10-29 VITALS — BP 136/72 | HR 85 | Temp 98.2°F | Ht 63.98 in | Wt 182.2 lb

## 2023-10-29 DIAGNOSIS — E2839 Other primary ovarian failure: Secondary | ICD-10-CM | POA: Diagnosis not present

## 2023-10-29 DIAGNOSIS — Z13220 Encounter for screening for lipoid disorders: Secondary | ICD-10-CM

## 2023-10-29 DIAGNOSIS — Z0000 Encounter for general adult medical examination without abnormal findings: Secondary | ICD-10-CM | POA: Diagnosis not present

## 2023-10-29 DIAGNOSIS — R41840 Attention and concentration deficit: Secondary | ICD-10-CM | POA: Diagnosis not present

## 2023-10-29 DIAGNOSIS — R7303 Prediabetes: Secondary | ICD-10-CM

## 2023-10-29 DIAGNOSIS — R051 Acute cough: Secondary | ICD-10-CM | POA: Insufficient documentation

## 2023-10-29 DIAGNOSIS — R5383 Other fatigue: Secondary | ICD-10-CM | POA: Insufficient documentation

## 2023-10-29 DIAGNOSIS — K648 Other hemorrhoids: Secondary | ICD-10-CM | POA: Insufficient documentation

## 2023-10-29 DIAGNOSIS — Z1231 Encounter for screening mammogram for malignant neoplasm of breast: Secondary | ICD-10-CM

## 2023-10-29 LAB — CBC WITH DIFFERENTIAL/PLATELET
Basophils Absolute: 0.1 10*3/uL (ref 0.0–0.1)
Basophils Relative: 0.9 % (ref 0.0–3.0)
Eosinophils Absolute: 0.2 10*3/uL (ref 0.0–0.7)
Eosinophils Relative: 2.2 % (ref 0.0–5.0)
HCT: 43.9 % (ref 36.0–46.0)
Hemoglobin: 13.9 g/dL (ref 12.0–15.0)
Lymphocytes Relative: 28.6 % (ref 12.0–46.0)
Lymphs Abs: 2.2 10*3/uL (ref 0.7–4.0)
MCHC: 31.8 g/dL (ref 30.0–36.0)
MCV: 84.1 fL (ref 78.0–100.0)
Monocytes Absolute: 0.6 10*3/uL (ref 0.1–1.0)
Monocytes Relative: 7.1 % (ref 3.0–12.0)
Neutro Abs: 4.8 10*3/uL (ref 1.4–7.7)
Neutrophils Relative %: 61.2 % (ref 43.0–77.0)
Platelets: 308 10*3/uL (ref 150.0–400.0)
RBC: 5.22 Mil/uL — ABNORMAL HIGH (ref 3.87–5.11)
RDW: 14.1 % (ref 11.5–15.5)
WBC: 7.8 10*3/uL (ref 4.0–10.5)

## 2023-10-29 LAB — COMPREHENSIVE METABOLIC PANEL
ALT: 52 U/L — ABNORMAL HIGH (ref 0–35)
AST: 35 U/L (ref 0–37)
Albumin: 4.8 g/dL (ref 3.5–5.2)
Alkaline Phosphatase: 73 U/L (ref 39–117)
BUN: 16 mg/dL (ref 6–23)
CO2: 28 meq/L (ref 19–32)
Calcium: 10.2 mg/dL (ref 8.4–10.5)
Chloride: 104 meq/L (ref 96–112)
Creatinine, Ser: 1.03 mg/dL (ref 0.40–1.20)
GFR: 57.14 mL/min — ABNORMAL LOW (ref >60.00)
Glucose, Bld: 93 mg/dL (ref 70–99)
Potassium: 5.1 meq/L (ref 3.5–5.1)
Sodium: 141 meq/L (ref 135–145)
Total Bilirubin: 0.4 mg/dL (ref 0.2–1.2)
Total Protein: 7.5 g/dL (ref 6.0–8.3)

## 2023-10-29 LAB — LIPID PANEL
Cholesterol: 210 mg/dL — ABNORMAL HIGH (ref 0–200)
HDL: 46.8 mg/dL (ref >39.00)
LDL Cholesterol: 131 mg/dL — ABNORMAL HIGH (ref 0–99)
NonHDL: 163.09
Total CHOL/HDL Ratio: 4
Triglycerides: 161 mg/dL — ABNORMAL HIGH (ref 0.0–149.0)
VLDL: 32.2 mg/dL (ref 0.0–40.0)

## 2023-10-29 LAB — HEMOGLOBIN A1C: Hgb A1c MFr Bld: 6.1 % (ref 4.6–6.5)

## 2023-10-29 LAB — T4, FREE: Free T4: 0.97 ng/dL (ref 0.60–1.60)

## 2023-10-29 LAB — VITAMIN B12: Vitamin B-12: 389 pg/mL (ref 211–911)

## 2023-10-29 LAB — IBC + FERRITIN
Ferritin: 105.4 ng/mL (ref 10.0–291.0)
Iron: 100 ug/dL (ref 42–145)
Saturation Ratios: 29.6 % (ref 20.0–50.0)
TIBC: 337.4 ug/dL (ref 250.0–450.0)
Transferrin: 241 mg/dL (ref 212.0–360.0)

## 2023-10-29 LAB — TSH: TSH: 1.17 u[IU]/mL (ref 0.35–5.50)

## 2023-10-29 LAB — VITAMIN D 25 HYDROXY (VIT D DEFICIENCY, FRACTURES): VITD: 24.72 ng/mL — ABNORMAL LOW (ref 30.00–100.00)

## 2023-10-29 LAB — T3, FREE: T3, Free: 3.7 pg/mL (ref 2.3–4.2)

## 2023-10-29 MED ORDER — FLUCONAZOLE 150 MG PO TABS: 150.0000 mg | ORAL_TABLET | Freq: Once | ORAL | 0 refills | Status: AC

## 2023-10-29 MED ORDER — AZITHROMYCIN 250 MG PO TABS: ORAL_TABLET | ORAL | 0 refills | Status: DC

## 2023-10-29 MED ORDER — HYDROCORTISONE ACETATE 25 MG RE SUPP: 25.0000 mg | Freq: Every day | RECTAL | 0 refills | Status: DC

## 2023-10-29 NOTE — Patient Instructions (Addendum)
Please stop at the lab to have labs drawn.  Increase exercise over time.  Increase water and fiber.

## 2023-10-29 NOTE — Assessment & Plan Note (Signed)
ACute, symptoms ongoing > 7-10 days, likely initital viral infection now with possible bacterial superinfection.  Treat with Zpack and cough suppressant.  Return and ER precautions given.

## 2023-10-29 NOTE — Assessment & Plan Note (Signed)
Treat with rectal suppositories x 6 days.  Increase fiber and water, can use miralax for constipation.  If still rectal bleeding.. follow up with GI.   Reviewed colonoscopy  from 2023

## 2023-10-29 NOTE — Assessment & Plan Note (Addendum)
Followed by Dr. Evelene Croon in past.  Toelrable control off adderall.

## 2023-10-29 NOTE — Progress Notes (Signed)
Chief Complaint  Patient presents with   Welcome to Medicare   Sinus Problem    Cough, nasal congestion, feel like her head is going to explode when she coughs   Blood pressure 136/72, pulse 85, temperature 98.2 F (36.8 C), temperature source Oral, height 5' 3.98" (1.625 m), weight 182 lb 3.2 oz (82.6 kg), SpO2 95%.  History of Present Illness: The patient presents for  welcome to medicare wellness, complete physical and review of chronic health problems. He/She also has the following acute concerns today:  Fatigue   Also notes brbpr when wiping... has hemorrhoids off and on, burning in rectum.   Using cream off and on.  She is not constipated, usually regular BMs.  1 week of cough and congestion... got from husband... neg flu and covid.  Face pain, sinus pressure.  No ear pain, no ST.    Mild SOB, no wheeze.  Deeper cough, moist, post nasal drip  Using ibuprofen and sudafed.   I have personally reviewed the Medicare Annual Wellness questionnaire and have noted 1. The patient's medical and social history 2. Their use of alcohol, tobacco or illicit drugs 3. Their current medications and supplements 4. The patient's functional ability including ADL's, fall risks, home safety risks and hearing or visual             impairment. 5. Diet and physical activities 6. Evidence for depression or mood disorders 7.         Updated provider list Cognitive evaluation was performed and recorded on pt medicare questionnaire form. The patients weight, height, BMI and visual acuity have been recorded in the chart   I have made referrals, counseling and provided education to the patient based review of the above and I have provided the pt with a written personalized care plan for preventive services.   Documentation of this information was scanned into the electronic record under the media tab.    Advance directives and end of life planning reviewed in detail with patient and documented in EMR.  Patient given handout on advance care directives if needed. HCPOA and living will updated if needed.  Hearing and vision screening entered in MyChart.  No falls in last 12 months.  Has had issues with fatigue. Sleeping well. New supplements may have helped some. She wonders if she would benefit from restarting HRT.  ADD:  Tolerable control off Adderall. Flowsheet Row Office Visit from 10/29/2023 in [facility]  PHQ-2 Total Score 1     She has retired from work.   Prediabetes   due for re-eval.  Elevated Cholesterol:   Due for re-eval. Lab Results  Component Value Date   CHOL 195 03/10/2020   HDL 56 03/10/2020   LDLCALC 114 (H) 03/10/2020   LDLDIRECT 130.9 11/09/2013   TRIG 130 03/10/2020   CHOLHDL 3.5 03/10/2020  The ASCVD Risk score (Arnett DK, et al., 2019) failed to calculate for the following reasons:   Cannot find a previous HDL lab   Cannot find a previous total cholesterol lab Using medications without problems: Muscle aches:  Diet compliance:moderate Exercise: 2 times a week aerobics Other complaints:   This visit occurred during the SARS-CoV-2 public health emergency.  Safety protocols were in place, including screening questions prior to the visit, additional usage of staff PPE, and extensive cleaning of exam room while observing appropriate contact time as indicated for disinfecting solutions.   COVID 19 screen:  No recent travel or known exposure  to COVID19 The patient denies respiratory symptoms of COVID 19 at this time. The importance of social distancing was discussed today.     Review of Systems  Constitutional:  Negative for chills and fever.  HENT:  Negative for congestion and ear pain.   Eyes:  Negative for pain and redness.  Respiratory:  Negative for cough and shortness of breath.   Cardiovascular:  Negative for chest pain, palpitations and leg swelling.  Gastrointestinal:  Negative for abdominal pain, blood in  stool, constipation, diarrhea, nausea and vomiting.  Genitourinary:  Negative for dysuria.  Musculoskeletal:  Negative for falls and myalgias.  Skin:  Negative for rash.  Neurological:  Negative for dizziness.  Psychiatric/Behavioral:  Negative for depression. The patient is not nervous/anxious.       Past Medical History:  Diagnosis Date   Abdominal discomfort    Endometriosis    hx    reports that she has never smoked. She has never used smokeless tobacco. She reports current alcohol use. She reports that she does not use drugs.   Current Outpatient Medications:    azithromycin (ZITHROMAX) 250 MG tablet, 2 tab po x 1 day then 1 tab po daily, Disp: 6 tablet, Rfl: 0   hydrocortisone (ANUSOL-HC) 25 MG suppository, Place 1 suppository (25 mg total) rectally daily., Disp: 6 suppository, Rfl: 0   Ibuprofen 200 MG CAPS, Take 400 mg by mouth every 6 (six) hours as needed for moderate pain., Disp: , Rfl:    omeprazole (PRILOSEC OTC) 20 MG tablet, Take 20 mg by mouth daily., Disp: , Rfl:    valACYclovir (VALTREX) 1000 MG tablet, TAKE 1 TABLET(1000 MG) BY MOUTH TWICE DAILY, Disp: 20 tablet, Rfl: 0   Observations/Objective: Blood pressure 100/60, pulse 74, temperature 98.1 F (36.7 C), temperature source Temporal, height 5' 4.25" (1.632 m), weight 172 lb 8 oz (78.2 kg), SpO2 98 %.  Physical Exam Constitutional:      General: She is not in acute distress.    Appearance: Normal appearance. She is well-developed. She is not ill-appearing or toxic-appearing.  HENT:     Head: Normocephalic.     Right Ear: Hearing, tympanic membrane, ear canal and external ear normal.     Left Ear: Hearing, tympanic membrane, ear canal and external ear normal.     Nose: Nose normal.  Eyes:     General: Lids are normal. Lids are everted, no foreign bodies appreciated.     Conjunctiva/sclera: Conjunctivae normal.     Pupils: Pupils are equal, round, and reactive to light.  Neck:     Thyroid: No thyroid mass or  thyromegaly.     Vascular: No carotid bruit.     Trachea: Trachea normal.  Cardiovascular:     Rate and Rhythm: Normal rate and regular rhythm.     Heart sounds: Normal heart sounds, S1 normal and S2 normal. No murmur heard.    No gallop.  Pulmonary:     Effort: Pulmonary effort is normal. No respiratory distress.     Breath sounds: Normal breath sounds. No wheezing, rhonchi or rales.  Abdominal:     General: Bowel sounds are normal. There is no distension or abdominal bruit.     Palpations: Abdomen is soft. There is no fluid wave or mass.     Tenderness: There is no abdominal tenderness. There is no guarding or rebound.     Hernia: No hernia is present.  Musculoskeletal:     Cervical back: Normal range of motion and neck  supple.  Lymphadenopathy:     Cervical: No cervical adenopathy.  Skin:    General: Skin is warm and dry.     Findings: No rash.  Neurological:     Mental Status: She is alert.     Cranial Nerves: No cranial nerve deficit.     Sensory: No sensory deficit.  Psychiatric:        Mood and Affect: Mood is not anxious or depressed.        Speech: Speech normal.        Behavior: Behavior normal. Behavior is cooperative.        Judgment: Judgment normal.      Assessment and Plan The patient's preventative maintenance and recommended screening tests for an annual wellness exam were reviewed in full today. Brought up to date unless services declined.  Counselled on the importance of diet, exercise, and its role in overall health and mortality. The patient's FH and SH was reviewed, including their home life, tobacco status, and drug and alcohol status.   EKG: normal EKG, normal sinus rhythm, unchanged from previous tracings   Vaccines: uptodate, S/P COVID19 vaccine, consider shingles vaccine, PNA vaccine and Tdap Pap/DVE:  partial hysterectomy, has left ovary. No pap indicated. Mammo:  07/2023 Bone Density:  no family history of osteoporosis... start  Age 35  years Colon: 06/2022 repeat in 5 years  Smoking Status: none ETOH/ drug use: occ/none  Hep C:   neg  HIV screen:   declined     Kerby Nora, MD

## 2023-11-20 ENCOUNTER — Telehealth: Payer: Self-pay

## 2023-11-20 NOTE — Telephone Encounter (Signed)
Spoke with Eunice Blase and she would like lab results mailed to her.  Results mailed as requested.

## 2023-11-20 NOTE — Telephone Encounter (Signed)
Copied from CRM [id number]. Topic: Clinical - Lab/Test Results >> Nov 20, 2023  2:03 PM Pascal Lux wrote: Reason for CRM: Patient called requesting a copy of her lab results 10/29/23.

## 2023-11-24 ENCOUNTER — Telehealth: Payer: Self-pay

## 2023-11-24 NOTE — Telephone Encounter (Signed)
 Pt is scheduled to establish care with you 12/16/2023.

## 2023-11-24 NOTE — Telephone Encounter (Signed)
 Copied from CRM [id number]. Topic: Appointments - Transfer of Care >> Nov 24, 2023 10:24 AM Isabell A wrote: Pt is requesting to transfer FROM: [facility] Pt is requesting to transfer TO: [facility]  Reason for requested transfer: Would like to change PCP to Ria Clock for second opinion, was referred by her daughter It is the responsibility of the team the patient would like to transfer to (Dr. Ria Clock) to reach out to the patient if for any reason this transfer is not acceptable.

## 2023-11-25 ENCOUNTER — Telehealth: Payer: Self-pay

## 2023-11-25 DIAGNOSIS — B078 Other viral warts: Secondary | ICD-10-CM | POA: Diagnosis not present

## 2023-11-25 DIAGNOSIS — L820 Inflamed seborrheic keratosis: Secondary | ICD-10-CM | POA: Diagnosis not present

## 2023-11-25 NOTE — Telephone Encounter (Signed)
 Asked pt to give [place] a cal back regarding upcoming appt. Pt is scheduled for TOC for 2nd option. Is pt really wanting to change pcp or does she need an acute visit with pcp to address cough?

## 2023-12-16 ENCOUNTER — Ambulatory Visit (INDEPENDENT_AMBULATORY_CARE_PROVIDER_SITE_OTHER): Payer: PPO | Admitting: Family

## 2023-12-16 ENCOUNTER — Ambulatory Visit (HOSPITAL_BASED_OUTPATIENT_CLINIC_OR_DEPARTMENT_OTHER)
Admission: RE | Admit: 2023-12-16 | Discharge: 2023-12-16 | Disposition: A | Discharge status: Home / Self Care | Source: Ambulatory Visit | Attending: Family | Admitting: Family

## 2023-12-16 ENCOUNTER — Other Ambulatory Visit: Payer: Self-pay | Admitting: Family

## 2023-12-16 ENCOUNTER — Encounter: Payer: Self-pay | Admitting: Family

## 2023-12-16 VITALS — BP 130/84 | HR 86 | Ht 63.98 in | Wt 180.0 lb

## 2023-12-16 DIAGNOSIS — R5383 Other fatigue: Secondary | ICD-10-CM

## 2023-12-16 DIAGNOSIS — R053 Chronic cough: Secondary | ICD-10-CM | POA: Diagnosis not present

## 2023-12-16 DIAGNOSIS — R7989 Other specified abnormal findings of blood chemistry: Secondary | ICD-10-CM

## 2023-12-16 DIAGNOSIS — R059 Cough, unspecified: Secondary | ICD-10-CM | POA: Diagnosis not present

## 2023-12-16 MED ORDER — BUPROPION HCL ER (XL) 150 MG PO TB24: 150.0000 mg | ORAL_TABLET | Freq: Every day | ORAL | 0 refills | Status: DC

## 2023-12-16 MED ORDER — PANTOPRAZOLE SODIUM 40 MG PO TBEC: 40.0000 mg | DELAYED_RELEASE_TABLET | Freq: Two times a day (BID) | ORAL | 1 refills | Status: DC

## 2023-12-16 NOTE — Progress Notes (Signed)
 Ashley Lin is a 68 y.o. female with the following history as recorded in EpicCare:  Patient Active Problem List   Diagnosis Date Noted   Internal hemorrhoid 10/29/2023   Acute cough 10/29/2023   Other fatigue 10/29/2023   COVID-19 05/09/2023   Right flank pain 08/10/2018   GERD (gastroesophageal reflux disease) 05/01/2016   Family history of stomach cancer 05/01/2016   Digital mucinous cyst 06/10/2014   Allergic rhinitis 11/09/2013   ADD (attention deficit disorder) 11/09/2013   Abdominal pain, epigastric 07/23/2012   Microscopic hematuria, persistant 07/23/2012   HX/o endometriosis 03/08/2008   FATIGUE, CHRONIC 02/29/2008    Current Outpatient Medications  Medication Sig Dispense Refill   buPROPion (WELLBUTRIN XL) 150 MG 24 hr tablet Take 1 tablet (150 mg total) by mouth daily. 90 tablet 0   cetirizine (ZYRTEC) 5 MG tablet Take 5 mg by mouth daily.     fluticasone (FLONASE) 50 MCG/ACT nasal spray Place into both nostrils daily.     Ibuprofen 200 MG CAPS Take 400 mg by mouth every 6 (six) hours as needed for moderate pain.     valACYclovir (VALTREX) 1000 MG tablet TAKE 1 TABLET(1000 MG) BY MOUTH TWICE DAILY 20 tablet 0   pantoprazole (PROTONIX) 40 MG tablet TAKE 1 TABLET(40 MG) BY MOUTH TWICE DAILY 180 tablet 0   No current facility-administered medications for this visit.    Allergies: Patient has no known allergies.  Past Medical History:  Diagnosis Date   Abdominal discomfort    Endometriosis    hx    Past Surgical History:  Procedure Laterality Date   ABDOMINAL HYSTERECTOMY     CHOLECYSTECTOMY N/A 09/07/2018   Procedure: LAPAROSCOPIC CHOLECYSTECTOMY;  Surgeon: Violeta Gelinas, MD;  Location: [facility] OR;  Service: General;  Laterality: N/A;   TONSILLECTOMY      No family history on file.  Social History   Tobacco Use   Smoking status: Never   Smokeless tobacco: Never  Substance Use Topics   Alcohol use: Yes    Comment: occasonal    Subjective:    Concerned about cough which has been present for "over a year." Saw her PCP at the end of January but notes that limited treatment options discussed; Does take Zyrtec and Flonase regularly; also take Omeprazole OTC for reflux issues and OTC "antacids" at night;  Also concerned about persisting fatigue/ irritability; notes that granddaughter takes Wellbutrin XL and she wonders if this could be a medication she could try;  Known history of pre-diabetes;    Objective:  Vitals:   12/16/23 1354 12/16/23 1608  BP: (!) 142/84 130/84  Pulse: 86   SpO2: 98%   Weight: 180 lb (81.6 kg)   Height: 5' 3.98" (1.625 m)     General: Well developed, well nourished, in no acute distress  Skin : Warm and dry.  Head: Normocephalic and atraumatic  Eyes: Sclera and conjunctiva clear; pupils round and reactive to light; extraocular movements intact  Ears: External normal; canals clear; tympanic membranes normal  Oropharynx: Pink, supple. No suspicious lesions  Neck: Supple without thyromegaly, adenopathy  Lungs: Respirations unlabored; clear to auscultation bilaterally without wheeze, rales, rhonchi  CVS exam: normal rate and regular rhythm.  Abdomen: Soft; nontender; nondistended; normoactive bowel sounds; no masses or hepatosplenomegaly  Musculoskeletal: No deformities; no active joint inflammation  Extremities: No edema, cyanosis, clubbing  Vessels: Symmetric bilaterally  Neurologic: Alert and oriented; speech intact; face symmetrical; moves all extremities well; CNII-XII intact without focal deficit   Assessment:  1. Persistent cough   2. Abnormal LFTs   3. Other fatigue     Plan:  Update CXR; ? If symptoms are related to uncontrolled GERD; change to Protonix 40 mg bid; follow up in 1 month; Check CMP today; Reviewed labs done at recent CPE- known history of pre-diabetes; patient is to discuss sleep pattern with her husband- ? Need for sleep study; trial of Wellbutrin XL 150 mg; follow up in 1  month;   No follow-ups on file.  Orders Placed This Encounter  Procedures   DG Chest 2 View    Standing Status:   Future    Number of Occurrences:   1    Expiration Date:   12/15/2024    Reason for Exam (SYMPTOM  OR DIAGNOSIS REQUIRED):   persistent cough    Preferred imaging location?:   [facility]   Comp Met (CMET)    Requested Prescriptions   Signed Prescriptions Disp Refills   buPROPion (WELLBUTRIN XL) 150 MG 24 hr tablet 90 tablet 0    Sig: Take 1 tablet (150 mg total) by mouth daily.

## 2023-12-17 ENCOUNTER — Encounter: Payer: Self-pay | Admitting: Family

## 2023-12-17 LAB — COMPREHENSIVE METABOLIC PANEL
ALT: 33 U/L (ref 0–35)
AST: 30 U/L (ref 0–37)
Albumin: 4.7 g/dL (ref 3.5–5.2)
Alkaline Phosphatase: 73 U/L (ref 39–117)
BUN: 16 mg/dL (ref 6–23)
CO2: 26 meq/L (ref 19–32)
Calcium: 9.9 mg/dL (ref 8.4–10.5)
Chloride: 104 meq/L (ref 96–112)
Creatinine, Ser: 1.1 mg/dL (ref 0.40–1.20)
GFR: 52.76 mL/min — ABNORMAL LOW (ref >60.00)
Glucose, Bld: 113 mg/dL — ABNORMAL HIGH (ref 70–99)
Potassium: 4.8 meq/L (ref 3.5–5.1)
Sodium: 140 meq/L (ref 135–145)
Total Bilirubin: 0.4 mg/dL (ref 0.2–1.2)
Total Protein: 7.6 g/dL (ref 6.0–8.3)

## 2023-12-30 ENCOUNTER — Encounter: Payer: Self-pay | Admitting: Family

## 2023-12-30 ENCOUNTER — Ambulatory Visit: Admitting: Family

## 2024-01-20 ENCOUNTER — Encounter: Payer: Self-pay | Admitting: Family

## 2024-01-20 ENCOUNTER — Ambulatory Visit (INDEPENDENT_AMBULATORY_CARE_PROVIDER_SITE_OTHER): Admitting: Family

## 2024-01-20 VITALS — BP 124/86 | HR 77 | Ht 63.98 in | Wt 181.4 lb

## 2024-01-20 DIAGNOSIS — W57XXXA Bitten or stung by nonvenomous insect and other nonvenomous arthropods, initial encounter: Secondary | ICD-10-CM | POA: Diagnosis not present

## 2024-01-20 DIAGNOSIS — S30860A Insect bite (nonvenomous) of lower back and pelvis, initial encounter: Secondary | ICD-10-CM

## 2024-01-20 DIAGNOSIS — R5383 Other fatigue: Secondary | ICD-10-CM

## 2024-01-20 DIAGNOSIS — K219 Gastro-esophageal reflux disease without esophagitis: Secondary | ICD-10-CM

## 2024-01-20 MED ORDER — PANTOPRAZOLE SODIUM 40 MG PO TBEC: 40.0000 mg | DELAYED_RELEASE_TABLET | Freq: Two times a day (BID) | ORAL | 3 refills | Status: DC

## 2024-01-20 MED ORDER — BUPROPION HCL ER (XL) 150 MG PO TB24: 150.0000 mg | ORAL_TABLET | Freq: Every day | ORAL | 3 refills | Status: DC

## 2024-01-20 NOTE — Progress Notes (Signed)
 Ashley Lin is a 68 y.o. female with the following history as recorded in EpicCare:  Patient Active Problem List   Diagnosis Date Noted   Internal hemorrhoid 10/29/2023   Acute cough 10/29/2023   Other fatigue 10/29/2023   COVID-19 05/09/2023   Right flank pain 08/10/2018   GERD (gastroesophageal reflux disease) 05/01/2016   Family history of stomach cancer 05/01/2016   Digital mucinous cyst 06/10/2014   Allergic rhinitis 11/09/2013   ADD (attention deficit disorder) 11/09/2013   Abdominal pain, epigastric 07/23/2012   Microscopic hematuria, persistant 07/23/2012   HX/o endometriosis 03/08/2008   FATIGUE, CHRONIC 02/29/2008    Current Outpatient Medications  Medication Sig Dispense Refill   cetirizine (ZYRTEC) 5 MG tablet Take 5 mg by mouth daily.     fluticasone (FLONASE) 50 MCG/ACT nasal spray Place into both nostrils daily.     Ibuprofen 200 MG CAPS Take 400 mg by mouth every 6 (six) hours as needed for moderate pain.     valACYclovir  (VALTREX ) 1000 MG tablet TAKE 1 TABLET(1000 MG) BY MOUTH TWICE DAILY 20 tablet 0   buPROPion  (WELLBUTRIN  XL) 150 MG 24 hr tablet Take 1 tablet (150 mg total) by mouth daily. 90 tablet 3   pantoprazole  (PROTONIX ) 40 MG tablet Take 1 tablet (40 mg total) by mouth 2 (two) times daily before a meal. 180 tablet 3   No current facility-administered medications for this visit.    Allergies: Patient has no known allergies.  Past Medical History:  Diagnosis Date   Abdominal discomfort    Endometriosis    hx    Past Surgical History:  Procedure Laterality Date   ABDOMINAL HYSTERECTOMY     CHOLECYSTECTOMY N/A 09/07/2018   Procedure: LAPAROSCOPIC CHOLECYSTECTOMY;  Surgeon: Dorena Gander, MD;  Location: [facility] OR;  Service: General;  Laterality: N/A;   TONSILLECTOMY      No family history on file.  Social History   Tobacco Use   Smoking status: Never   Smokeless tobacco: Never  Substance Use Topics   Alcohol use: Yes    Comment:  occasonal    Subjective:   1 month follow up on persistent cough/ symptoms of cough have been present for the past year at least; normal CXR; was thought to be GERD related- was changed to Protonix  and does feel that symptoms are improved;   Today concerned about possible reaction from tick bite- thinks tick bite happened in the past month; Questioning if head may still be present;    Objective:  Vitals:   01/20/24 1328  BP: 124/86  Pulse: 77  SpO2: 96%  Weight: 181 lb 6.4 oz (82.3 kg)  Height: 5' 3.98" (1.625 m)    General: Well developed, well nourished, in no acute distress  Skin : Warm and dry. Localized area of erythema noted at base of spine with dark center- able to remove small tick head Head: Normocephalic and atraumatic  Eyes: Sclera and conjunctiva clear; pupils round and reactive to light; extraocular movements intact  Ears: External normal; canals clear; tympanic membranes normal  Oropharynx: Pink, supple. No suspicious lesions  Neck: Supple without thyromegaly, adenopathy  Lungs: Respirations unlabored; clear to auscultation bilaterally without wheeze, rales, rhonchi  Neurologic: Alert and oriented; speech intact; face symmetrical; moves all extremities well; CNII-XII intact without focal deficit   Assessment:  1. Gastroesophageal reflux disease, unspecified whether esophagitis present   2. Other fatigue   3. Tick bite of lower back, initial encounter     Plan:  Good  response to Protonix - continue bid for now- will need to discussing changing to every day at next OV; Good response to Wellbutrin  XL- continue same dosage; refill updated; Able to remove tick head with no complications- no signs of infection;   Return in about 9 months (around 10/21/2024) for CPE.  No orders of the defined types were placed in this encounter.   Requested Prescriptions   Signed Prescriptions Disp Refills   pantoprazole  (PROTONIX ) 40 MG tablet 180 tablet 3    Sig: Take 1 tablet (40  mg total) by mouth 2 (two) times daily before a meal.   buPROPion  (WELLBUTRIN  XL) 150 MG 24 hr tablet 90 tablet 3    Sig: Take 1 tablet (150 mg total) by mouth daily.

## 2024-01-28 DIAGNOSIS — X32XXXD Exposure to sunlight, subsequent encounter: Secondary | ICD-10-CM | POA: Diagnosis not present

## 2024-01-28 DIAGNOSIS — L570 Actinic keratosis: Secondary | ICD-10-CM | POA: Diagnosis not present

## 2024-03-08 ENCOUNTER — Other Ambulatory Visit: Payer: Self-pay | Admitting: Family

## 2024-04-28 ENCOUNTER — Other Ambulatory Visit: Payer: Self-pay | Admitting: Family Medicine

## 2024-05-25 ENCOUNTER — Telehealth: Payer: Self-pay | Admitting: Family

## 2024-05-25 NOTE — Telephone Encounter (Signed)
 Copied from CRM [id number]. Topic: Appointments - Transfer of Care >> May 25, 2024 11:16 AM Chiquita SQUIBB wrote: Pt is requesting to transfer FROM: Leita Elbe Pt is requesting to transfer TO: Tinnie Harada Reason for requested transfer: Leita Elbe is leaving the office.  It is the responsibility of the team the patient would like to transfer to Sandor Harada) to reach out to the patient if for any reason this transfer is not acceptable.

## 2024-07-26 ENCOUNTER — Ambulatory Visit
Admission: RE | Admit: 2024-07-26 | Discharge: 2024-07-26 | Disposition: A | Discharge status: Home / Self Care | Payer: PPO | Source: Ambulatory Visit | Attending: Family Medicine | Admitting: Family Medicine

## 2024-07-26 ENCOUNTER — Other Ambulatory Visit: Payer: PPO

## 2024-07-26 DIAGNOSIS — Z1231 Encounter for screening mammogram for malignant neoplasm of breast: Secondary | ICD-10-CM | POA: Diagnosis not present

## 2024-07-29 ENCOUNTER — Ambulatory Visit: Payer: Self-pay | Admitting: Family Medicine

## 2024-09-17 ENCOUNTER — Ambulatory Visit: Payer: Self-pay

## 2024-09-17 NOTE — Telephone Encounter (Signed)
 Appt scheduled

## 2024-09-17 NOTE — Telephone Encounter (Signed)
 Mild soreness to L throat, L sided intermittent earache, puffiness to L throat, pt states she can see a bump when she looks in mirror  FYI Only or Action Required?: FYI only for provider: appointment scheduled on 09/20/24.  Patient was last seen in primary care on 01/20/2024 by Jason Leita Repine, FNP.  Called Nurse Triage reporting Sore Throat and Otalgia.  Symptoms began several weeks ago.  Interventions attempted: Nothing.  Symptoms are: stable.  Triage Disposition: See PCP When Office is Open (Within 3 Days)  Patient/caregiver understands and will follow disposition?: Yes  Copied from CRM #[id number]. Topic: Clinical - Red Word Triage >> Sep 17, 2024  1:56 PM Rosina BIRCH wrote: Red Word that prompted transfer to Nurse Triage: patient is having left ear pain, throat hurts and has a puffiness at her neck on the left side for two weeks Reason for Disposition  [1] Sore throat with cough/cold symptoms AND [2] present > 5 days  Answer Assessment - Initial Assessment Questions 1. ONSET: When did the throat start hurting? (Hours or days ago)      2 weeks ago  2. SEVERITY: How bad is the sore throat? (Scale 1-10; mild, moderate or severe)     Mild  3. STREP EXPOSURE: Has there been any exposure to strep within the past week? If Yes, ask: What type of contact occurred?      Denies  4.  VIRAL SYMPTOMS: Are there any symptoms of a cold, such as a runny nose, cough, hoarse voice or red eyes?      Stuffy nose, mild cough  5. FEVER: Do you have a fever? If Yes, ask: What is your temperature, how was it measured, and when did it start?     Denies  Mild soreness to L throat, L sided intermittent earache, puffiness to L throat, pt states she can see a bump when she looks in mirror  Protocols used: Sore Throat-A-AH

## 2024-09-20 ENCOUNTER — Ambulatory Visit (INDEPENDENT_AMBULATORY_CARE_PROVIDER_SITE_OTHER): Admitting: Family

## 2024-09-20 ENCOUNTER — Encounter: Payer: Self-pay | Admitting: Family

## 2024-09-20 ENCOUNTER — Ambulatory Visit: Admitting: Family

## 2024-09-20 VITALS — BP 149/69 | HR 85 | Temp 98.2°F | Resp 16 | Ht 65.0 in | Wt 185.6 lb

## 2024-09-20 DIAGNOSIS — J029 Acute pharyngitis, unspecified: Secondary | ICD-10-CM

## 2024-09-20 DIAGNOSIS — R221 Localized swelling, mass and lump, neck: Secondary | ICD-10-CM

## 2024-09-20 LAB — POCT RAPID STREP A (OFFICE): Rapid Strep A Screen: NEGATIVE

## 2024-09-20 MED ORDER — AMOXICILLIN-POT CLAVULANATE 875-125 MG PO TABS: 1.0000 | ORAL_TABLET | Freq: Two times a day (BID) | ORAL | 0 refills | Status: DC

## 2024-09-20 MED ORDER — BUPROPION HCL ER (XL) 150 MG PO TB24: 150.0000 mg | ORAL_TABLET | Freq: Every day | ORAL | 1 refills | Status: AC

## 2024-09-20 NOTE — Progress Notes (Signed)
 "  Acute Office Visit  Subjective:     Patient ID: Ashley Lin, female    DOB: July 21, 1956, 68 y.o.   MRN: [id number]  Chief Complaint  Patient presents with   Ear Pain    Patient has some left side ear and throat pain, along with swollen lymph node.    HPI Patient is in today with complaints of left ear pain, sore throat, congestion x 3 days.  She denies any fever or chills.  Has taken ibuprofen that helps.  Patient also has concerns of left neck swelling down and above the clavicle on the left side.  Denies any injury.  Is unsure what the cause is.  Denies pain.  Last mammogram was October 2025 and was normal.  Review of Systems  Constitutional:  Negative for chills and fever.  HENT:  Positive for congestion.        Swelling noted to the left neck and above the clavicle  Respiratory:  Positive for cough and wheezing.   Cardiovascular: Negative.   Musculoskeletal: Negative.   Neurological: Negative.   Endo/Heme/Allergies: Negative.   Psychiatric/Behavioral: Negative.     Past Medical History:  Diagnosis Date   Abdominal discomfort    Endometriosis    hx    Social History   Socioeconomic History   Marital status: Single    Spouse name: Not on file   Number of children: Not on file   Years of education: Not on file   Highest education level: Not on file  Occupational History   Not on file  Tobacco Use   Smoking status: Never   Smokeless tobacco: Never  Substance and Sexual Activity   Alcohol use: Yes    Comment: occasonal   Drug use: No   Sexual activity: Not on file  Other Topics Concern   Not on file  Social History Narrative   Not on file   Social Drivers of Health   Tobacco Use: Low Risk (09/20/2024)   Patient History    Smoking Tobacco Use: Never    Smokeless Tobacco Use: Never    Passive Exposure: Not on file  Financial Resource Strain: Not on file  Food Insecurity: Not on file  Transportation Needs: Not on file   Physical Activity: Not on file  Stress: Not on file  Social Connections: Not on file  Intimate Partner Violence: Not on file  Depression (EYV7-0): Low Risk (10/29/2023)   Depression (PHQ2-9)    PHQ-2 Score: 3  Alcohol Screen: Not on file  Housing: Not on file  Utilities: Not on file  Health Literacy: Not on file    Past Surgical History:  Procedure Laterality Date   ABDOMINAL HYSTERECTOMY     CHOLECYSTECTOMY N/A 09/07/2018   Procedure: LAPAROSCOPIC CHOLECYSTECTOMY;  Surgeon: Sebastian Moles, MD;  Location: [facility] OR;  Service: General;  Laterality: N/A;   TONSILLECTOMY      No family history on file.  Allergies[1]  Medications Ordered Prior to Encounter[2]  BP (!) 149/69 (BP Location: Left Arm, Patient Position: Sitting, Cuff Size: Normal)   Pulse 85   Temp 98.2 F (36.8 C) (Oral)   Resp 16   Ht 5' 5 (1.651 m)   Wt 185 lb 9.6 oz (84.2 kg)   SpO2 98%   BMI 30.89 kg/m chart      Objective:    BP (!) 149/69 (BP Location: Left Arm, Patient Position: Sitting, Cuff Size: Normal)   Pulse 85   Temp 98.2 F (36.8 C) (Oral)  Resp 16   Ht 5' 5 (1.651 m)   Wt 185 lb 9.6 oz (84.2 kg)   SpO2 98%   BMI 30.89 kg/m    Physical Exam Constitutional:      Appearance: Normal appearance. She is normal weight.  HENT:     Right Ear: Tympanic membrane, ear canal and external ear normal.     Left Ear: Tympanic membrane, ear canal and external ear normal.     Nose: Nose normal.     Mouth/Throat:     Mouth: Mucous membranes are moist.     Pharynx: Oropharynx is clear.  Neck:     Comments: Swelling noted to the left neck extending down to the supraclavicular area.  Nontender.  Mild lymphadenopathy noted to the cervical chain. Cardiovascular:     Rate and Rhythm: Normal rate and regular rhythm.  Pulmonary:     Effort: Pulmonary effort is normal.     Breath sounds: No wheezing.  Musculoskeletal:        General: Normal range of motion.     Cervical back: Normal range of  motion and neck supple.  Skin:    General: Skin is warm and dry.  Neurological:     General: No focal deficit present.     Mental Status: She is alert and oriented to person, place, and time. Mental status is at baseline.  Psychiatric:        Mood and Affect: Mood normal.        Behavior: Behavior normal.        Thought Content: Thought content normal.        Judgment: Judgment normal.    Results for orders placed or performed in visit on 09/20/24  POCT rapid strep A  Result Value Ref Range   Rapid Strep A Screen Negative Negative        Assessment & Plan:   Problem List Items Addressed This Visit   None Visit Diagnoses       Sore throat    -  Primary   Relevant Orders   POCT rapid strep A (Completed)   CBC w/Diff     Localized swelling, mass and lump, neck       Relevant Orders   US  Soft Tissue Head/Neck (NON-THYROID )   CBC w/Diff       Meds ordered this encounter  Medications   buPROPion  (WELLBUTRIN  XL) 150 MG 24 hr tablet    Sig: Take 1 tablet (150 mg total) by mouth daily.    Dispense:  90 tablet    Refill:  1   amoxicillin -clavulanate (AUGMENTIN ) 875-125 MG tablet    Sig: Take 1 tablet by mouth 2 (two) times daily.    Dispense:  20 tablet    Refill:  0  Ultrasound ordered of the soft tissue of the left neck and supraclavicular area.  Will notify patient pending results.  In the meantime, we will treat with Augmentin  twice a day for 10 days.  Call the office if symptoms worsen or persist.  Recheck as scheduled and sooner as needed.  No follow-ups on file.  Hasaan Radde B Tsion Inghram, FNP       [1] No Known Allergies [2] Current Outpatient Medications on File Prior to Visit  Medication Sig Dispense Refill   cetirizine (ZYRTEC) 5 MG tablet Take 5 mg by mouth daily.     fluticasone (FLONASE) 50 MCG/ACT nasal spray Place into both nostrils daily.     Ibuprofen 200 MG CAPS Take 400 mg  by mouth every 6 (six) hours as needed for moderate pain.      pantoprazole  (PROTONIX ) 40 MG tablet Take 1 tablet (40 mg total) by mouth 2 (two) times daily before a meal. 180 tablet 1   valACYclovir  (VALTREX ) 1000 MG tablet TAKE 1 TABLET(1000 MG) BY MOUTH TWICE DAILY 20 tablet 0   No current facility-administered medications on file prior to visit.  "

## 2024-10-05 ENCOUNTER — Ambulatory Visit: Payer: Self-pay | Admitting: Family

## 2024-10-05 ENCOUNTER — Ambulatory Visit (HOSPITAL_BASED_OUTPATIENT_CLINIC_OR_DEPARTMENT_OTHER)
Admission: RE | Admit: 2024-10-05 | Discharge: 2024-10-05 | Disposition: A | Discharge status: Home / Self Care | Source: Ambulatory Visit | Attending: Family | Admitting: Family

## 2024-10-05 DIAGNOSIS — R221 Localized swelling, mass and lump, neck: Secondary | ICD-10-CM | POA: Insufficient documentation

## 2024-10-29 ENCOUNTER — Ambulatory Visit: Admitting: Nurse Practitioner

## 2024-10-29 ENCOUNTER — Encounter: Admitting: Family

## 2024-10-29 ENCOUNTER — Encounter: Payer: Self-pay | Admitting: Nurse Practitioner

## 2024-10-29 VITALS — BP 160/90 | HR 84 | Temp 97.7°F | Ht 65.0 in | Wt 187.0 lb

## 2024-10-29 DIAGNOSIS — R5382 Chronic fatigue, unspecified: Secondary | ICD-10-CM

## 2024-10-29 DIAGNOSIS — Z0000 Encounter for general adult medical examination without abnormal findings: Secondary | ICD-10-CM | POA: Insufficient documentation

## 2024-10-29 DIAGNOSIS — Z780 Asymptomatic menopausal state: Secondary | ICD-10-CM

## 2024-10-29 DIAGNOSIS — M6740 Ganglion, unspecified site: Secondary | ICD-10-CM | POA: Insufficient documentation

## 2024-10-29 DIAGNOSIS — K146 Glossodynia: Secondary | ICD-10-CM

## 2024-10-29 DIAGNOSIS — E7800 Pure hypercholesterolemia, unspecified: Secondary | ICD-10-CM | POA: Insufficient documentation

## 2024-10-29 DIAGNOSIS — R030 Elevated blood-pressure reading, without diagnosis of hypertension: Secondary | ICD-10-CM | POA: Insufficient documentation

## 2024-10-29 DIAGNOSIS — K219 Gastro-esophageal reflux disease without esophagitis: Secondary | ICD-10-CM

## 2024-10-29 DIAGNOSIS — R7303 Prediabetes: Secondary | ICD-10-CM | POA: Insufficient documentation

## 2024-10-29 MED ORDER — PANTOPRAZOLE SODIUM 40 MG PO TBEC: 40.0000 mg | DELAYED_RELEASE_TABLET | Freq: Every day | ORAL | 3 refills | Status: AC

## 2024-10-29 NOTE — Assessment & Plan Note (Signed)
 Chronic, stable. Last A1c 6.1%. Will check again next visit

## 2024-10-29 NOTE — Progress Notes (Addendum)
 "  New Patient Visit  BP (!) 160/90 (BP Location: Left Arm, Cuff Size: Large)   Pulse 84   Temp 97.7 F (36.5 C)   Ht 5' 5 (1.651 m)   Wt 187 lb (84.8 kg)   SpO2 98%   BMI 31.12 kg/m    Subjective:    Patient ID: Ashley Lin, female    DOB: September 17, 1956, 69 y.o.   MRN: [id number]  CC: Chief Complaint  Patient presents with   Establish Care    NP. Est, Care, concerns with knot left side of neck-had an ultrasound and antibiotics, knot on left wrist and bilateral wrist pain at night    HPI: Ashley Lin is a 69 y.o. female presents to transfer care to a new provider. Introduced to publishing rights manager role and practice setting. All questions answered. Discussed provider/patient relationship and expectations.  Discussed the use of AI scribe software for clinical note transcription with the patient, who gave verbal consent to proceed.  History of Present Illness   Ashley Lin is a 69 year old female who presents with a sore throat and wrist pain.  She has ongoing lump of skin at the base of her left note associated with intermittent sore throat and ear pain. An ultrasound showed no abnormalities. A throat culture was negative. She has occasional ear discomfort but no fever and notes a new raw spot on her tongue.  She has a stable knot on her left wrist that has not grown but is painful, especially at night and in cold weather. Pain improves when she squeezes her hand. She uses ibuprofen intermittently. She previously had a similar cyst on a finger that required surgical removal.  Her fatigue improved after starting Wellbutrin . Her acid reflux is well controlled with Protonix  at night and Prilosec during the day. She has ADD and previously took Adderall but stopped, and she still has some difficulty with concentration and reading.  Her family history includes cancer in her sister (stomach) and her mother. She drinks alcohol occasionally and  does not smoke or vape. She denies chest pain, shortness of breath, dizziness, headaches, urinary symptoms, rashes, or other skin changes.     Depression and Anxiety Screen done:     10/29/2024    1:12 PM 10/29/2023   10:11 AM 08/28/2022   11:47 AM 03/14/2020    3:06 PM 04/10/2017   11:41 AM  Depression screen PHQ 2/9  Decreased Interest 0 1 0 0 0  Down, Depressed, Hopeless 0 0 0 0 0  PHQ - 2 Score 0 1 0 0 0  Altered sleeping 0 2     Tired, decreased energy 0 0     Change in appetite 0 0     Feeling bad or failure about yourself  0 0     Trouble concentrating 0 0     Moving slowly or fidgety/restless 0 0     Suicidal thoughts 0 0     PHQ-9 Score 0 3      Difficult doing work/chores Not difficult at all Somewhat difficult        Data saved with a previous flowsheet row definition      10/29/2023   10:12 AM  GAD 7 : Generalized Anxiety Score  Nervous, Anxious, on Edge 0   Control/stop worrying 0   Worry too much - different things 0   Trouble relaxing 0   Restless 0   Easily annoyed or irritable 0  Afraid - awful might happen 0   Total GAD 7 Score 0  Anxiety Difficulty Not difficult at all     Data saved with a previous flowsheet row definition    Past Medical History:  Diagnosis Date   Abdominal discomfort    ADD (attention deficit disorder)    Endometriosis    hx   GERD (gastroesophageal reflux disease)    Hyperlipidemia    Prediabetes     Past Surgical History:  Procedure Laterality Date   ABDOMINAL HYSTERECTOMY     CHOLECYSTECTOMY N/A 09/07/2018   Procedure: LAPAROSCOPIC CHOLECYSTECTOMY;  Surgeon: Sebastian Moles, MD;  Location: [facility] OR;  Service: General;  Laterality: N/A;   TONSILLECTOMY      Family History  Problem Relation Age of Onset   Cancer Mother        breast   Cancer Sister        stomach     Social History[1]  Medications Ordered Prior to Encounter[2]   Review of Systems  Constitutional:  Positive for fatigue. Negative for fever.   HENT:  Positive for ear pain (left ear occasionally) and sore throat. Negative for postnasal drip and rhinorrhea.   Respiratory: Negative.    Cardiovascular: Negative.   Gastrointestinal: Negative.   Genitourinary: Negative.   Musculoskeletal:  Positive for arthralgias (wrists).  Skin:        Lump of skin near base of neck  Neurological: Negative.   Psychiatric/Behavioral: Negative.         Objective:    BP (!) 160/90 (BP Location: Left Arm, Cuff Size: Large)   Pulse 84   Temp 97.7 F (36.5 C)   Ht 5' 5 (1.651 m)   Wt 187 lb (84.8 kg)   SpO2 98%   BMI 31.12 kg/m   Wt Readings from Last 3 Encounters:  10/29/24 187 lb (84.8 kg)  09/20/24 185 lb 9.6 oz (84.2 kg)  01/20/24 181 lb 6.4 oz (82.3 kg)    BP Readings from Last 3 Encounters:  10/29/24 (!) 160/90  09/20/24 (!) 149/69  01/20/24 124/86    Physical Exam Vitals and nursing note reviewed.  Constitutional:      General: She is not in acute distress.    Appearance: Normal appearance.  HENT:     Head: Normocephalic and atraumatic.     Right Ear: Tympanic membrane, ear canal and external ear normal.     Left Ear: Tympanic membrane, ear canal and external ear normal.     Mouth/Throat:     Mouth: Mucous membranes are moist.     Pharynx: No posterior oropharyngeal erythema.     Comments: Small ulcer on right side of tongue Eyes:     Conjunctiva/sclera: Conjunctivae normal.  Cardiovascular:     Rate and Rhythm: Normal rate and regular rhythm.     Pulses: Normal pulses.     Heart sounds: Normal heart sounds.  Pulmonary:     Effort: Pulmonary effort is normal.     Breath sounds: Normal breath sounds.  Abdominal:     Palpations: Abdomen is soft.     Tenderness: There is no abdominal tenderness.  Musculoskeletal:        General: Normal range of motion.     Cervical back: Normal range of motion and neck supple.     Right lower leg: No edema.     Left lower leg: No edema.  Lymphadenopathy:     Cervical: No  cervical adenopathy.  Skin:    General: Skin  is warm and dry.     Comments: Small ganglion cyst to left lateral wrist. Small area of swelling at base of left neck   Neurological:     General: No focal deficit present.     Mental Status: She is alert and oriented to person, place, and time.     Cranial Nerves: No cranial nerve deficit.     Coordination: Coordination normal.     Gait: Gait normal.  Psychiatric:        Mood and Affect: Mood normal.        Behavior: Behavior normal.        Thought Content: Thought content normal.        Judgment: Judgment normal.       Assessment & Plan:   Problem List Items Addressed This Visit       Digestive   GERD (gastroesophageal reflux disease)   Chronic, stable. Continue protonix  daily in the evening and omeprazole in the morning. Limit foods that trigger symptoms.       Relevant Medications   pantoprazole  (PROTONIX ) 40 MG tablet     Other   Fatigue   Chronic, stable. Reviewed labs from last year, thyroid  normal. She is taking vitamin D  supplement. Continue wellbutrin  150mg  daily.       Elevated blood pressure reading   Blood pressure is 160/90 mmHg, higher than previous readings. Monitor blood pressure at home or local pharmacies. Reduce dietary salt intake. Follow up in 1-2 months to reassess.      Ganglion cyst   The ganglion cyst causes intermittent pain but has not increased in size. There is potential for pain or size increase over time. No immediate intervention is needed unless symptoms worsen. Monitor for changes in size or pain. Consider referral to orthopedics if the cyst becomes painful or increases in size.       Pure hypercholesterolemia   Chronic, stable. Discussed nutrition, exercise. Will check labs next visit.       Prediabetes   Chronic, stable. Last A1c 6.1%. Will check again next visit       Routine general medical examination at a health care facility - Primary   Health maintenance reviewed and updated.  Discussed nutrition, exercise.       Other Visit Diagnoses       Postmenopausal estrogen deficiency       Dexa scan ordered today   Relevant Orders   DG Bone Density     Tongue sore       Rinse mouth with warm salt water 3 times a day. F/U if not improving       Follow up plan: Return in about 2 months (around 12/27/2024) for 1-2 months elevated pressure.  Allizon Woznick A Caytlyn Evers     [1]  Social History Tobacco Use   Smoking status: Never   Smokeless tobacco: Never  Vaping Use   Vaping status: Never Used  Substance Use Topics   Alcohol use: Yes    Comment: ocasionally   Drug use: No  [2]  Current Outpatient Medications on File Prior to Visit  Medication Sig Dispense Refill   buPROPion  (WELLBUTRIN  XL) 150 MG 24 hr tablet Take 1 tablet (150 mg total) by mouth daily. 90 tablet 1   cetirizine (ZYRTEC) 5 MG tablet Take 5 mg by mouth daily.     fluticasone (FLONASE) 50 MCG/ACT nasal spray Place into both nostrils daily.     Ibuprofen 200 MG CAPS Take 400 mg by mouth every 6 (  six) hours as needed for moderate pain.     valACYclovir  (VALTREX ) 1000 MG tablet TAKE 1 TABLET(1000 MG) BY MOUTH TWICE DAILY 20 tablet 0   No current facility-administered medications on file prior to visit.   "

## 2024-10-29 NOTE — Assessment & Plan Note (Signed)
 Chronic, stable. Discussed nutrition, exercise. Will check labs next visit.

## 2024-10-29 NOTE — Patient Instructions (Signed)
 It was great to see you!  Start checking your blood pressure at home. Limit salt.   We will get you scheduled for the dexa scan   You can keep taking tylenol  or ibuprofen as needed for wrist pain   Let's follow-up in 1-2 months, sooner if you have concerns.  If a referral was placed today, you will be contacted for an appointment. Please note that routine referrals can sometimes take up to 3-4 weeks to process. Please call our office if you haven't heard anything after this time frame.  Take care,  Tinnie Harada, NP

## 2024-10-29 NOTE — Assessment & Plan Note (Signed)
 Chronic, stable. Continue protonix  daily in the evening and omeprazole in the morning. Limit foods that trigger symptoms.

## 2024-10-29 NOTE — Assessment & Plan Note (Signed)
 Chronic, stable. Reviewed labs from last year, thyroid  normal. She is taking vitamin D  supplement. Continue wellbutrin  150mg  daily.

## 2024-10-29 NOTE — Assessment & Plan Note (Signed)
 The ganglion cyst causes intermittent pain but has not increased in size. There is potential for pain or size increase over time. No immediate intervention is needed unless symptoms worsen. Monitor for changes in size or pain. Consider referral to orthopedics if the cyst becomes painful or increases in size.

## 2024-10-29 NOTE — Assessment & Plan Note (Signed)
 Blood pressure is 160/90 mmHg, higher than previous readings. Monitor blood pressure at home or local pharmacies. Reduce dietary salt intake. Follow up in 1-2 months to reassess.

## 2024-10-29 NOTE — Assessment & Plan Note (Signed)
 Health maintenance reviewed and updated. Discussed nutrition, exercise.

## 2024-12-14 ENCOUNTER — Other Ambulatory Visit

## 2024-12-28 ENCOUNTER — Ambulatory Visit: Admitting: Nurse Practitioner
